# Patient Record
Sex: Female | Born: 1975 | Race: Black or African American | Hispanic: No | Marital: Married | Smoking: Never smoker
Health system: Southern US, Community
[De-identification: ages and names within clinical notes are randomized; demographics above are authoritative.]

## PROBLEM LIST (undated history)

## (undated) DIAGNOSIS — R87629 Unspecified abnormal cytological findings in specimens from vagina: Secondary | ICD-10-CM

## (undated) HISTORY — DX: Unspecified abnormal cytological findings in specimens from vagina: R87.629

## (undated) HISTORY — PX: NO PAST SURGERIES: SHX2092

---

## 2007-10-25 ENCOUNTER — Emergency Department (HOSPITAL_COMMUNITY): Admission: EM | Admit: 2007-10-25 | Discharge: 2007-10-25 | Payer: Self-pay | Admitting: Emergency Medicine

## 2007-10-25 ENCOUNTER — Ambulatory Visit: Payer: Self-pay | Admitting: Vascular Surgery

## 2007-10-25 ENCOUNTER — Encounter (INDEPENDENT_AMBULATORY_CARE_PROVIDER_SITE_OTHER): Payer: Self-pay | Admitting: Emergency Medicine

## 2012-07-11 ENCOUNTER — Other Ambulatory Visit: Payer: Self-pay

## 2012-07-11 ENCOUNTER — Other Ambulatory Visit (HOSPITAL_COMMUNITY): Payer: Self-pay | Admitting: Obstetrics and Gynecology

## 2012-07-11 DIAGNOSIS — Z3689 Encounter for other specified antenatal screening: Secondary | ICD-10-CM

## 2012-07-11 DIAGNOSIS — O09522 Supervision of elderly multigravida, second trimester: Secondary | ICD-10-CM

## 2012-07-15 ENCOUNTER — Encounter (HOSPITAL_COMMUNITY): Payer: Self-pay | Admitting: Obstetrics and Gynecology

## 2012-07-18 ENCOUNTER — Ambulatory Visit (HOSPITAL_COMMUNITY): Payer: Self-pay

## 2012-07-18 ENCOUNTER — Ambulatory Visit (HOSPITAL_COMMUNITY): Payer: Self-pay | Attending: Obstetrics and Gynecology

## 2012-07-23 ENCOUNTER — Ambulatory Visit (HOSPITAL_COMMUNITY)
Admission: RE | Admit: 2012-07-23 | Discharge: 2012-07-23 | Disposition: A | Discharge status: Home / Self Care | Payer: 59 | Source: Ambulatory Visit | Attending: Obstetrics and Gynecology | Admitting: Obstetrics and Gynecology

## 2012-07-23 ENCOUNTER — Encounter (HOSPITAL_COMMUNITY): Payer: Self-pay

## 2012-07-23 DIAGNOSIS — O0930 Supervision of pregnancy with insufficient antenatal care, unspecified trimester: Secondary | ICD-10-CM | POA: Insufficient documentation

## 2012-07-23 DIAGNOSIS — O09522 Supervision of elderly multigravida, second trimester: Secondary | ICD-10-CM

## 2012-07-23 DIAGNOSIS — E669 Obesity, unspecified: Secondary | ICD-10-CM | POA: Insufficient documentation

## 2012-07-23 DIAGNOSIS — O09529 Supervision of elderly multigravida, unspecified trimester: Secondary | ICD-10-CM | POA: Insufficient documentation

## 2012-07-23 DIAGNOSIS — Z3689 Encounter for other specified antenatal screening: Secondary | ICD-10-CM

## 2012-07-23 NOTE — Progress Notes (Signed)
Genetic Counseling  High-Risk Gestation Note  Appointment Date:  07/23/2012 Referred By: Brittany Bailiff, MD Date of Birth:  1975/04/10 Partner:  Brittany Fowler    Pregnancy History: [id number] Estimated Date of Delivery: 11/01/12 Estimated Gestational Age: [redacted]w[redacted]d Attending: Particia Nearing, MD   [name] and her husband, Mr. Brittany Fowler, were seen for genetic counseling because of a maternal age of 37 y.o.. An English/Swahili interpreter from [facility] provided interpretation during today's visit.      They were counseled regarding maternal age and the association with risk for chromosome conditions due to nondisjunction with aging of the ova.   We reviewed chromosomes, nondisjunction, and the associated 1 in 111 risk for fetal aneuploidy related to a maternal age of 37 y.o. at [redacted]w[redacted]d gestation.  They were counseled that the risk for aneuploidy decreases as gestational age increases, accounting for those pregnancies which spontaneously abort.  We specifically discussed Down syndrome (trisomy 85), trisomies 20 and 73, and sex chromosome aneuploidies (47,XXX and 47,XXY) including the common features and prognoses of each.   We reviewed available screening options including noninvasive prenatal screening (NIPS)/cell free fetal DNA (cffDNA) testing and detailed ultrasound.  They were counseled that screening tests are used to modify a patient's a priori risk for aneuploidy, typically based on age. This estimate provides a pregnancy specific risk assessment. We reviewed the benefits and limitations of each option. Specifically, we discussed the conditions for which each test screens, the detection rates, and false positive rates of each. They were also counseled regarding diagnostic testing via amniocentesis. We reviewed the approximate 1 in 300-500 risk for complications for amniocentesis, including spontaneous preterm labor and delivery. After consideration of all the  options, they declined NIPS and amniocentesis at the time of today's visit.    The patient elected to proceed with detailed ultrasound only.  A complete ultrasound was performed today. The ultrasound report will be sent under separate cover. There were no visualized fetal anomalies or markers suggestive of aneuploidy. Diagnostic testing was declined today.  They understand that screening tests cannot rule out all birth defects or genetic syndromes. The patient was advised of this limitation and states she still does not want additional testing at this time.   [facility] was provided with written information regarding sickle cell anemia (SCA) including the carrier frequency and incidence in the African population, the availability of carrier testing and prenatal diagnosis if indicated.  In addition, we discussed that hemoglobinopathies are routinely screened for as part of the [facility] newborn screening panel.  She declined hemoglobin electrophoresis today given that she was unsure if this was performed at the time of her initial OB blood work in June.  Both family histories were reviewed and found to be noncontributory for birth defects, mental retardation, and known genetic conditions. Without further information regarding the provided family history, an accurate genetic risk cannot be calculated. Further genetic counseling is warranted if more information is obtained.  [name] denied exposure to environmental toxins or chemical agents. She denied the use of alcohol, tobacco or street drugs. She denied significant viral illnesses during the course of her pregnancy. Her medical and surgical histories were noncontributory.   I counseled this couple regarding the above risks and available options.  The approximate face-to-face time with the genetic counselor was 35 minutes.  Brittany Plowman, MS,  Certified Genetic Counselor 07/23/2012

## 2013-05-28 ENCOUNTER — Encounter (HOSPITAL_COMMUNITY): Payer: Self-pay | Admitting: *Deleted

## 2013-11-17 ENCOUNTER — Encounter (HOSPITAL_COMMUNITY): Payer: Self-pay | Admitting: *Deleted

## 2014-11-02 LAB — OB RESULTS CONSOLE GC/CHLAMYDIA
CHLAMYDIA, DNA PROBE: NEGATIVE
Gonorrhea: NEGATIVE

## 2014-11-02 LAB — OB RESULTS CONSOLE ABO/RH: RH Type: POSITIVE

## 2014-11-02 LAB — OB RESULTS CONSOLE VARICELLA ZOSTER ANTIBODY, IGG: Varicella: IMMUNE

## 2014-11-02 LAB — OB RESULTS CONSOLE RPR: RPR: NONREACTIVE

## 2014-11-02 LAB — OB RESULTS CONSOLE HIV ANTIBODY (ROUTINE TESTING): HIV: NONREACTIVE

## 2014-11-02 LAB — OB RESULTS CONSOLE PLATELET COUNT: PLATELETS: 228 10*3/uL

## 2014-11-02 LAB — OB RESULTS CONSOLE HGB/HCT, BLOOD
HEMATOCRIT: 34 %
HEMOGLOBIN: 11.8 g/dL

## 2014-11-02 LAB — OB RESULTS CONSOLE ANTIBODY SCREEN: ANTIBODY SCREEN: NEGATIVE

## 2014-11-03 ENCOUNTER — Encounter (HOSPITAL_COMMUNITY): Payer: Self-pay | Admitting: Obstetrics and Gynecology

## 2014-11-09 ENCOUNTER — Other Ambulatory Visit (HOSPITAL_COMMUNITY): Payer: Self-pay | Admitting: Obstetrics and Gynecology

## 2014-11-09 DIAGNOSIS — O09522 Supervision of elderly multigravida, second trimester: Secondary | ICD-10-CM

## 2014-11-09 DIAGNOSIS — Z3689 Encounter for other specified antenatal screening: Secondary | ICD-10-CM

## 2014-11-10 ENCOUNTER — Encounter (HOSPITAL_COMMUNITY): Payer: Medicaid Other

## 2014-11-10 ENCOUNTER — Ambulatory Visit (HOSPITAL_COMMUNITY): Payer: Medicaid Other

## 2014-11-11 ENCOUNTER — Encounter (HOSPITAL_COMMUNITY): Payer: Medicaid Other

## 2014-11-11 ENCOUNTER — Ambulatory Visit (HOSPITAL_COMMUNITY): Payer: Medicaid Other

## 2014-11-18 ENCOUNTER — Ambulatory Visit (HOSPITAL_COMMUNITY): Payer: Medicaid Other

## 2014-11-18 ENCOUNTER — Ambulatory Visit (HOSPITAL_COMMUNITY): Admission: RE | Admit: 2014-11-18 | Payer: Medicaid Other | Source: Ambulatory Visit

## 2014-11-18 ENCOUNTER — Encounter (HOSPITAL_COMMUNITY): Payer: Medicaid Other

## 2014-11-26 ENCOUNTER — Encounter (HOSPITAL_COMMUNITY): Payer: Self-pay

## 2014-11-26 ENCOUNTER — Other Ambulatory Visit (HOSPITAL_COMMUNITY): Payer: Self-pay | Admitting: Obstetrics and Gynecology

## 2014-11-26 ENCOUNTER — Encounter (HOSPITAL_COMMUNITY): Payer: Medicaid Other

## 2014-11-26 ENCOUNTER — Ambulatory Visit (HOSPITAL_COMMUNITY)
Admission: RE | Admit: 2014-11-26 | Discharge: 2014-11-26 | Disposition: A | Discharge status: Home / Self Care | Payer: Medicaid Other | Source: Ambulatory Visit | Attending: Obstetrics and Gynecology | Admitting: Obstetrics and Gynecology

## 2014-11-26 DIAGNOSIS — O99212 Obesity complicating pregnancy, second trimester: Secondary | ICD-10-CM | POA: Insufficient documentation

## 2014-11-26 DIAGNOSIS — Z3A23 23 weeks gestation of pregnancy: Secondary | ICD-10-CM

## 2014-11-26 DIAGNOSIS — O09522 Supervision of elderly multigravida, second trimester: Secondary | ICD-10-CM

## 2014-11-26 DIAGNOSIS — Z3689 Encounter for other specified antenatal screening: Secondary | ICD-10-CM

## 2014-11-26 DIAGNOSIS — O281 Abnormal biochemical finding on antenatal screening of mother: Secondary | ICD-10-CM | POA: Insufficient documentation

## 2014-11-26 DIAGNOSIS — Z36 Encounter for antenatal screening of mother: Secondary | ICD-10-CM | POA: Diagnosis not present

## 2014-11-26 DIAGNOSIS — Z3A22 22 weeks gestation of pregnancy: Secondary | ICD-10-CM | POA: Insufficient documentation

## 2014-11-27 ENCOUNTER — Other Ambulatory Visit (HOSPITAL_COMMUNITY): Payer: Self-pay

## 2014-12-22 ENCOUNTER — Encounter: Payer: Self-pay | Admitting: *Deleted

## 2014-12-22 ENCOUNTER — Encounter (HOSPITAL_COMMUNITY): Payer: Self-pay

## 2014-12-22 DIAGNOSIS — O09519 Supervision of elderly primigravida, unspecified trimester: Secondary | ICD-10-CM | POA: Insufficient documentation

## 2014-12-22 DIAGNOSIS — O24410 Gestational diabetes mellitus in pregnancy, diet controlled: Secondary | ICD-10-CM | POA: Insufficient documentation

## 2014-12-22 DIAGNOSIS — O24419 Gestational diabetes mellitus in pregnancy, unspecified control: Secondary | ICD-10-CM

## 2014-12-29 ENCOUNTER — Other Ambulatory Visit: Payer: Self-pay | Admitting: Obstetrics & Gynecology

## 2014-12-29 DIAGNOSIS — Z3A31 31 weeks gestation of pregnancy: Secondary | ICD-10-CM

## 2014-12-29 DIAGNOSIS — O09523 Supervision of elderly multigravida, third trimester: Secondary | ICD-10-CM

## 2014-12-29 DIAGNOSIS — Z3689 Encounter for other specified antenatal screening: Secondary | ICD-10-CM

## 2015-01-18 ENCOUNTER — Encounter: Payer: Self-pay | Admitting: Obstetrics & Gynecology

## 2015-01-18 ENCOUNTER — Encounter: Payer: Medicaid Other | Attending: Obstetrics & Gynecology | Admitting: *Deleted

## 2015-01-18 ENCOUNTER — Ambulatory Visit (INDEPENDENT_AMBULATORY_CARE_PROVIDER_SITE_OTHER): Payer: Medicaid Other | Admitting: Obstetrics & Gynecology

## 2015-01-18 VITALS — BP 112/70 | HR 102 | Temp 98.8°F | Wt 241.6 lb

## 2015-01-18 DIAGNOSIS — O09523 Supervision of elderly multigravida, third trimester: Secondary | ICD-10-CM

## 2015-01-18 DIAGNOSIS — O99213 Obesity complicating pregnancy, third trimester: Secondary | ICD-10-CM

## 2015-01-18 DIAGNOSIS — Z713 Dietary counseling and surveillance: Secondary | ICD-10-CM | POA: Insufficient documentation

## 2015-01-18 DIAGNOSIS — E669 Obesity, unspecified: Secondary | ICD-10-CM

## 2015-01-18 DIAGNOSIS — O24419 Gestational diabetes mellitus in pregnancy, unspecified control: Secondary | ICD-10-CM | POA: Diagnosis present

## 2015-01-18 LAB — POCT URINALYSIS DIP (DEVICE)
Glucose, UA: NEGATIVE mg/dL
HGB URINE DIPSTICK: NEGATIVE
LEUKOCYTES UA: NEGATIVE
NITRITE: NEGATIVE
PH: 6.5 (ref 5.0–8.0)
Protein, ur: NEGATIVE mg/dL
Specific Gravity, Urine: 1.02 (ref 1.005–1.030)
Urobilinogen, UA: 0.2 mg/dL (ref 0.0–1.0)

## 2015-01-18 MED ORDER — ACCU-CHEK MULTICLIX LANCETS MISC: Status: DC

## 2015-01-18 MED ORDER — GLUCOSE BLOOD VI STRP: ORAL_STRIP | Status: DC

## 2015-01-18 NOTE — Progress Notes (Signed)
Here for first visit. Transferring care from health department. Given new patient education booklets. Used [employer].

## 2015-01-18 NOTE — Progress Notes (Signed)
   Subjective: transfer from [facility] for failed 1 hr gtt 228    Brittany Fowler is a [id number] 4028w5d being seen today for her first obstetrical visit.  Her obstetrical history is significant for advanced maternal age, obesity and gestational diabetes. Patient does intend to breast feed. Pregnancy history fully reviewed.  Patient reports no complaints.  Filed Vitals:   01/18/15 0940  BP: 112/70  Pulse: 102  Temp: 98.8 F (37.1 C)  Weight: 241 lb 9.6 oz (109.589 kg)    HISTORY: OB History  Gravida Para Term Preterm AB SAB TAB Ectopic Multiple Living  8 6 6  0 1 1 0 0  6    # Outcome Date GA Lbr Len/2nd Weight Sex Delivery Anes PTL Lv  8 Current           7 SAB 05/2014          6 Term 11/04/12 3375w0d  9 lb (4.082 kg) F Vag-Spont None N Y     Comments: states blood sugar was high at first , then normal   5 Term 09/18/09 6175w0d  9 lb (4.082 kg) M Vag-Spont None N Y  4 Term 07/15/02 8075w0d  6 lb 6 oz (2.892 kg) M Vag-Spont None N Y  3 Term 03/17/99 4875w0d  8 lb 8 oz (3.856 kg) F Vag-Spont None N Y  2 Term 02/22/97 6775w0d  7 lb (3.175 kg) M Vag-Spont None N Y  1 Term 08/06/94 5975w0d  7 lb 7 oz (3.374 kg) F Vag-Spont None N Y     Past Medical History  Diagnosis Date  . Vaginal Pap smear, abnormal     2011   Past Surgical History  Procedure Laterality Date  . No past surgeries     History reviewed. No pertinent family history.   Exam    Uterus:  Fundal Height: 34 cm  Pelvic Exam:                                    Skin: normal coloration and turgor, no rashes    Neurologic: oriented, normal mood   Extremities: normal strength, tone, and muscle mass   HEENT extra ocular movement intact   Mouth/Teeth dental hygiene good   Neck supple   Cardiovascular: regular rate and rhythm   Respiratory:  appears well, vitals normal, no respiratory distress, a              Assessment:    Pregnancy: [id number] Patient Active Problem List   Diagnosis Date Noted  .  Gestational diabetes mellitus (GDM), antepartum 12/22/2014  . AMA (advanced maternal age) primigravida 35+ 12/22/2014        Plan:     Initial labs drawn. Prenatal vitamins. Problem list reviewed and updated. Genetic Screening discussed was declined  Ultrasound discussed; fetal survey: results reviewed.  Follow up in 1 weeks. 50% of 30 min visit spent on counseling and coordination of care.  DM education today, BS 4/day   ARNOLD,JAMES 01/18/2015

## 2015-01-18 NOTE — Progress Notes (Signed)
  Patient was seen on 01/18/15 for Gestational Diabetes self-management . The following learning objectives were met by the patient :   States the definition of Gestational Diabetes  States why dietary management is important in controlling blood glucose  Describes the effects of carbohydrates on blood glucose levels  Demonstrates ability to create a balanced meal plan  States when to check blood glucose levels  Demonstrates proper blood glucose monitoring techniques  Plan:  Begin checking BG before breakfast and 2 hours after first bit of breakfast, lunch and dinner after  as directed by MD  Take medication  as directed by MD  Blood glucose monitor given: Georgeanne Nim Lot # [id number] Exp: 2015/10/16 Blood glucose reading: FBS this date '115mg'$ /dl  Patient instructed to monitor glucose levels: FBS: 60 - <90 2 hour: <120  Patient will be seen for follow-up as needed.

## 2015-01-18 NOTE — Patient Instructions (Signed)
Gestational Diabetes Mellitus  Gestational diabetes mellitus, often simply referred to as gestational diabetes, is a type of diabetes that some women develop during pregnancy. In gestational diabetes, the pancreas does not make enough insulin (a hormone), the cells are less responsive to the insulin that is made (insulin resistance), or both. Normally, insulin moves sugars from food into the tissue cells. The tissue cells use the sugars for energy. The lack of insulin or the lack of normal response to insulin causes excess sugars to build up in the blood instead of going into the tissue cells. As a result, high blood sugar (hyperglycemia) develops. The effect of high sugar (glucose) levels can cause many problems.   RISK FACTORS  You have an increased chance of developing gestational diabetes if you have a family history of diabetes and also have one or more of the following risk factors:  · A body mass index over 30 (obesity).  · A previous pregnancy with gestational diabetes.  · An older age at the time of pregnancy.  If blood glucose levels are kept in the normal range during pregnancy, women can have a healthy pregnancy. If your blood glucose levels are not well controlled, there may be risks to you, your unborn baby (fetus), your labor and delivery, or your newborn baby.   SYMPTOMS   If symptoms are experienced, they are much like symptoms you would normally expect during pregnancy. The symptoms of gestational diabetes include:   · Increased thirst (polydipsia).  · Increased urination (polyuria).  · Increased urination during the night (nocturia).  · Weight loss. This weight loss may be rapid.  · Frequent, recurring infections.  · Tiredness (fatigue).  · Weakness.  · Vision changes, such as blurred vision.  · Fruity smell to your breath.  · Abdominal pain.  DIAGNOSIS  Diabetes is diagnosed when blood glucose levels are increased. Your blood glucose level may be checked by one or more of the following blood  tests:  · A fasting blood glucose test. You will not be allowed to eat for at least 8 hours before a blood sample is taken.  · A random blood glucose test. Your blood glucose is checked at any time of the day regardless of when you ate.  · An oral glucose tolerance test (OGTT). Your blood glucose is measured after you have not eaten (fasted) for 1-3 hours and then after you drink a glucose-containing beverage. Since the hormones that cause insulin resistance are highest at about 24-28 weeks of a pregnancy, an OGTT is usually performed during that time. If you have risk factors, you may be screened for undiagnosed type 2 diabetes at your first prenatal visit.  TREATMENT   Gestational diabetes should be managed first with diet and exercise. Medicines may be added only if they are needed.  · You will need to take diabetes medicine or insulin daily to keep blood glucose levels in the desired range.  · You will need to match insulin dosing with exercise and healthy food choices.  If you have gestational diabetes, your treatment goal is to maintain the following blood glucose levels:  · Before meals (preprandial): at or below 95 mg/dL.  · After meals (postprandial):    One hour after a meal: at or below 140 mg/dL.    Two hours after a meal: at or below 120 mg/dL.  If you have pre-existing type 1 or type 2 diabetes, your treatment goal is to maintain the following blood glucose levels:  · Before   meals, at bedtime, and overnight: 60-99 mg/dL.  · After meals: peak of 100-129 mg/dL.  HOME CARE INSTRUCTIONS   · Have your hemoglobin A1c level checked twice a year.  · Perform daily blood glucose monitoring as directed by your health care provider. It is common to perform frequent blood glucose monitoring.  · Monitor urine ketones when you are ill and as directed by your health care provider.  · Take your diabetes medicine and insulin as directed by your health care provider to maintain your blood glucose level in the desired  range.  ¨ Never run out of diabetes medicine or insulin. It is needed every day.  ¨ Adjust insulin based on your intake of carbohydrates. Carbohydrates can raise blood glucose levels but need to be included in your diet. Carbohydrates provide vitamins, minerals, and fiber which are an essential part of a healthy diet. Carbohydrates are found in fruits, vegetables, whole grains, dairy products, legumes, and foods containing added sugars.  · Eat healthy foods. Alternate 3 meals with 3 snacks.  · Maintain a healthy weight gain. The usual total expected weight gain varies according to your prepregnancy body mass index (BMI).  · Carry a medical alert card or wear your medical alert jewelry.  · Carry a 15-gram carbohydrate snack with you at all times to treat low blood glucose (hypoglycemia). Some examples of 15-gram carbohydrate snacks include:  ¨ Glucose tablets, 3 or 4.  ¨ Glucose gel, 15-gram tube.  ¨ Raisins, 2 tablespoons (24 g).  ¨ Jelly beans, 6.  ¨ Animal crackers, 8.  ¨ Fruit juice, regular soda, or low-fat milk, 4 ounces (120 mL).  ¨ Gummy treats, 9.  · Recognize hypoglycemia. Hypoglycemia during pregnancy occurs with blood glucose levels of 60 mg/dL and below. The risk for hypoglycemia increases when fasting or skipping meals, during or after intense exercise, and during sleep. Hypoglycemia symptoms can include:  ¨ Tremors or shakes.  ¨ Decreased ability to concentrate.  ¨ Sweating.  ¨ Increased heart rate.  ¨ Headache.  ¨ Dry mouth.  ¨ Hunger.  ¨ Irritability.  ¨ Anxiety.  ¨ Restless sleep.  ¨ Altered speech or coordination.  ¨ Confusion.  · Treat hypoglycemia promptly. If you are alert and able to safely swallow, follow the 15:15 rule:  ¨ Take 15-20 grams of rapid-acting glucose or carbohydrate. Rapid-acting options include glucose gel, glucose tablets, or 4 ounces (120 mL) of fruit juice, regular soda, or low-fat milk.  ¨ Check your blood glucose level 15 minutes after taking the glucose.  ¨ Take 15-20  grams more of glucose if the repeat blood glucose level is still 70 mg/dL or below.  ¨ Eat a meal or snack within 1 hour once blood glucose levels return to normal.  · Be alert to polyuria (excess urination) and polydipsia (excess thirst) which are early signs of hyperglycemia. An early awareness of hyperglycemia allows for prompt treatment. Treat hyperglycemia as directed by your health care provider.  · Engage in at least 30 minutes of physical activity a day or as directed by your health care provider. Ten minutes of physical activity timed 30 minutes after each meal is encouraged to control postprandial blood glucose levels.  · Adjust your insulin dosing and food intake as needed if you start a new exercise or sport.  · Follow your sick-day plan at any time you are unable to eat or drink as usual.  · Avoid tobacco and alcohol use.  · Keep all follow-up visits as directed   by your health care provider.  · Follow the advice of your health care provider regarding your prenatal and post-delivery (postpartum) appointments, meal planning, exercise, medicines, vitamins, blood tests, other medical tests, and physical activities.  · Perform daily skin and foot care. Examine your skin and feet daily for cuts, bruises, redness, nail problems, bleeding, blisters, or sores.  · Brush your teeth and gums at least twice a day and floss at least once a day. Follow up with your dentist regularly.  · Schedule an eye exam during the first trimester of your pregnancy or as directed by your health care provider.  · Share your diabetes management plan with your workplace or school.  · Stay up-to-date with immunizations.  · Learn to manage stress.  · Obtain ongoing diabetes education and support as needed.  · Learn about and consider breastfeeding your baby.  · You should have your blood sugar level checked 6-12 weeks after delivery. This is done with an oral glucose tolerance test (OGTT).  SEEK MEDICAL CARE IF:   · You are unable to  eat food or drink fluids for more than 6 hours.  · You have nausea and vomiting for more than 6 hours.  · You have a blood glucose level of 200 mg/dL and you have ketones in your urine.  · There is a change in mental status.  · You develop vision problems.  · You have a persistent headache.  · You have upper abdominal pain or discomfort.  · You develop an additional serious illness.  · You have diarrhea for more than 6 hours.  · You have been sick or have had a fever for a couple of days and are not getting better.  SEEK IMMEDIATE MEDICAL CARE IF:   · You have difficulty breathing.  · You no longer feel the baby moving.  · You are bleeding or have discharge from your vagina.  · You start having premature contractions or labor.  MAKE SURE YOU:  · Understand these instructions.  · Will watch your condition.  · Will get help right away if you are not doing well or get worse.     This information is not intended to replace advice given to you by your health care provider. Make sure you discuss any questions you have with your health care provider.     Document Released: 04/10/2000 Document Revised: 01/23/2014 Document Reviewed: 08/01/2011  Elsevier Interactive Patient Education ©2016 Elsevier Inc[employer]

## 2015-01-21 ENCOUNTER — Ambulatory Visit (HOSPITAL_COMMUNITY)
Admission: RE | Admit: 2015-01-21 | Discharge: 2015-01-21 | Disposition: A | Discharge status: Home / Self Care | Payer: Medicaid Other | Source: Ambulatory Visit | Attending: Obstetrics & Gynecology | Admitting: Obstetrics & Gynecology

## 2015-01-21 ENCOUNTER — Other Ambulatory Visit: Payer: Self-pay | Admitting: Obstetrics & Gynecology

## 2015-01-21 VITALS — BP 107/68 | HR 75 | Wt 242.8 lb

## 2015-01-21 DIAGNOSIS — O99213 Obesity complicating pregnancy, third trimester: Secondary | ICD-10-CM | POA: Diagnosis not present

## 2015-01-21 DIAGNOSIS — O24410 Gestational diabetes mellitus in pregnancy, diet controlled: Secondary | ICD-10-CM

## 2015-01-21 DIAGNOSIS — Z3A31 31 weeks gestation of pregnancy: Secondary | ICD-10-CM | POA: Diagnosis not present

## 2015-01-21 DIAGNOSIS — O281 Abnormal biochemical finding on antenatal screening of mother: Secondary | ICD-10-CM

## 2015-01-21 DIAGNOSIS — O09523 Supervision of elderly multigravida, third trimester: Secondary | ICD-10-CM

## 2015-01-21 DIAGNOSIS — O09513 Supervision of elderly primigravida, third trimester: Secondary | ICD-10-CM

## 2015-01-21 DIAGNOSIS — Z3689 Encounter for other specified antenatal screening: Secondary | ICD-10-CM

## 2015-01-25 ENCOUNTER — Encounter: Payer: Medicaid Other | Admitting: *Deleted

## 2015-01-25 ENCOUNTER — Ambulatory Visit (INDEPENDENT_AMBULATORY_CARE_PROVIDER_SITE_OTHER): Payer: Medicaid Other | Admitting: Obstetrics and Gynecology

## 2015-01-25 VITALS — BP 124/72 | HR 95 | Temp 97.8°F | Wt 239.7 lb

## 2015-01-25 DIAGNOSIS — O24419 Gestational diabetes mellitus in pregnancy, unspecified control: Secondary | ICD-10-CM | POA: Diagnosis not present

## 2015-01-25 DIAGNOSIS — Z23 Encounter for immunization: Secondary | ICD-10-CM | POA: Diagnosis not present

## 2015-01-25 LAB — POCT URINALYSIS DIP (DEVICE)
Bilirubin Urine: NEGATIVE
Glucose, UA: NEGATIVE mg/dL
HGB URINE DIPSTICK: NEGATIVE
KETONES UR: NEGATIVE mg/dL
Leukocytes, UA: NEGATIVE
Nitrite: NEGATIVE
PH: 6.5 (ref 5.0–8.0)
PROTEIN: NEGATIVE mg/dL
Specific Gravity, Urine: 1.02 (ref 1.005–1.030)
Urobilinogen, UA: 0.2 mg/dL (ref 0.0–1.0)

## 2015-01-25 LAB — CBC
HEMATOCRIT: 35.5 % — AB (ref 36.0–46.0)
Hemoglobin: 11.9 g/dL — ABNORMAL LOW (ref 12.0–15.0)
MCH: 28.3 pg (ref 26.0–34.0)
MCHC: 33.5 g/dL (ref 30.0–36.0)
MCV: 84.5 fL (ref 78.0–100.0)
MPV: 9.2 fL (ref 8.6–12.4)
Platelets: 243 10*3/uL (ref 150–400)
RBC: 4.2 MIL/uL (ref 3.87–5.11)
RDW: 14.5 % (ref 11.5–15.5)
WBC: 6.8 10*3/uL (ref 4.0–10.5)

## 2015-01-25 MED ORDER — TETANUS-DIPHTH-ACELL PERTUSSIS 5-2.5-18.5 LF-MCG/0.5 IM SUSP
0.5000 mL | Freq: Once | INTRAMUSCULAR | Status: AC
  Administered 2015-01-25: 0.5 mL via INTRAMUSCULAR

## 2015-01-25 NOTE — Progress Notes (Signed)
Subjective:  Brynda RimHamenyimana Kanai is a 40 y.o. [id number] at 5871w5d being seen today for ongoing prenatal care.  She is currently monitored for the following issues for this high-risk pregnancy and has Gestational diabetes mellitus (GDM), antepartum and AMA (advanced maternal age) primigravida 35+ on her problem list.  Patient reports no complaints.  Contractions: Not present. Vag. Bleeding: None.  Movement: Present. Denies leaking of fluid.   Hasn't checked glucose for one week. Medicaid ran out, doesn't have needles and strips.   The following portions of the patient's history were reviewed and updated as appropriate: allergies, current medications, past family history, past medical history, past social history, past surgical history and problem list. Problem list updated.  Objective:   Filed Vitals:   01/25/15 0948  BP: 124/72  Pulse: 95  Temp: 97.8 F (36.6 C)  Weight: 239 lb 11.2 oz (108.727 kg)    Fetal Status: Fetal Heart Rate (bpm): 150   Movement: Present     General:  Alert, oriented and cooperative. Patient is in no acute distress.  Skin: Skin is warm and dry. No rash noted.   Cardiovascular: Normal heart rate noted  Respiratory: Normal respiratory effort, no problems with respiration noted  Abdomen: Soft, gravid, appropriate for gestational age. Pain/Pressure: Absent     Pelvic: Vag. Bleeding: None     Cervical exam deferred        Extremities: Normal range of motion.  Edema: None  Mental Status: Normal mood and affect. Normal behavior. Normal judgment and thought content.   Urinalysis: Urine Protein: Negative Urine Glucose: Negative  Assessment and Plan:  Pregnancy: [id number] at 7671w5d  1. Gestational diabetes mellitus (GDM), antepartum, gestational diabetes method of control unspecified - meet with diabetes educator, get testing supplies, f/u 1 week, if a2gdm would need to start antenatal testing  - CBC - HIV antibody (with reflex) - RPR - tdap - we have no records  of pap smear; patient confident this was done at [facility], will submit records request  Preterm labor symptoms and general obstetric precautions including but not limited to vaginal bleeding, contractions, leaking of fluid and fetal movement were reviewed in detail with the patient. Please refer to After Visit Summary for other counseling recommendations.    Kathrynn RunningNoah Bedford [place], MD

## 2015-01-25 NOTE — Addendum Note (Signed)
Addended by: Garret ReddishBARNES, Kimm Ungaro M on: 01/25/2015 11:10 AM   Modules accepted: Orders

## 2015-01-25 NOTE — Progress Notes (Signed)
28 week labs today Tdap Vaccine undecided Breastfeeding tip of the week reviewed [facility] [place] interpreter [phone] used

## 2015-01-25 NOTE — Progress Notes (Signed)
Patient presents with husband noting that she does not have Medicaid as we initially thought. Therefore she has not been testing since her last visit. Her FBS  This date was 128mg /dl.  Meter Dispensed: [employer] Lot: [id number] Exp: 2017-03-11 FBS 128mg /dl I will see patient in one week to review glucose readings.

## 2015-01-26 LAB — RPR

## 2015-01-26 LAB — HIV ANTIBODY (ROUTINE TESTING W REFLEX): HIV 1&2 Ab, 4th Generation: NONREACTIVE

## 2015-01-29 ENCOUNTER — Encounter: Payer: Self-pay | Admitting: *Deleted

## 2015-01-29 DIAGNOSIS — O24419 Gestational diabetes mellitus in pregnancy, unspecified control: Secondary | ICD-10-CM

## 2015-02-01 ENCOUNTER — Encounter: Payer: Medicaid Other | Admitting: *Deleted

## 2015-02-01 ENCOUNTER — Ambulatory Visit: Payer: Medicaid Other | Admitting: *Deleted

## 2015-02-01 DIAGNOSIS — O24419 Gestational diabetes mellitus in pregnancy, unspecified control: Secondary | ICD-10-CM | POA: Diagnosis not present

## 2015-02-01 NOTE — Progress Notes (Signed)
Patient presents with husband for review of glucose readings. FBS range 89-133, 2hpp range 101-145. Patient is having too many carbs with meals and not having snacks. Has reeducated on dietary balance. Patient and husband verbalize understanding.

## 2015-02-08 ENCOUNTER — Ambulatory Visit (INDEPENDENT_AMBULATORY_CARE_PROVIDER_SITE_OTHER): Payer: Medicaid Other | Admitting: Family Medicine

## 2015-02-08 ENCOUNTER — Telehealth: Payer: Self-pay | Admitting: General Practice

## 2015-02-08 VITALS — BP 115/73 | HR 93 | Temp 99.1°F | Wt 238.8 lb

## 2015-02-08 DIAGNOSIS — O09523 Supervision of elderly multigravida, third trimester: Secondary | ICD-10-CM | POA: Diagnosis not present

## 2015-02-08 DIAGNOSIS — O98813 Other maternal infectious and parasitic diseases complicating pregnancy, third trimester: Secondary | ICD-10-CM | POA: Diagnosis not present

## 2015-02-08 DIAGNOSIS — O24419 Gestational diabetes mellitus in pregnancy, unspecified control: Secondary | ICD-10-CM | POA: Diagnosis not present

## 2015-02-08 DIAGNOSIS — B373 Candidiasis of vulva and vagina: Secondary | ICD-10-CM

## 2015-02-08 DIAGNOSIS — O24414 Gestational diabetes mellitus in pregnancy, insulin controlled: Secondary | ICD-10-CM

## 2015-02-08 DIAGNOSIS — O0990 Supervision of high risk pregnancy, unspecified, unspecified trimester: Secondary | ICD-10-CM | POA: Insufficient documentation

## 2015-02-08 DIAGNOSIS — B372 Candidiasis of skin and nail: Secondary | ICD-10-CM

## 2015-02-08 DIAGNOSIS — O0993 Supervision of high risk pregnancy, unspecified, third trimester: Secondary | ICD-10-CM

## 2015-02-08 LAB — POCT URINALYSIS DIP (DEVICE)
GLUCOSE, UA: NEGATIVE mg/dL
Nitrite: NEGATIVE
PH: 7 (ref 5.0–8.0)
Protein, ur: 30 mg/dL — AB
Specific Gravity, Urine: 1.02 (ref 1.005–1.030)
Urobilinogen, UA: 1 mg/dL (ref 0.0–1.0)

## 2015-02-08 MED ORDER — NYSTATIN EX POWD: 1.0000 "application " | Freq: Two times a day (BID) | CUTANEOUS | Status: AC

## 2015-02-08 MED ORDER — GLYBURIDE 2.5 MG PO TABS
2.5000 mg | ORAL_TABLET | Freq: Every day | ORAL | Status: DC
Start: 2015-02-08 — End: 2015-02-22

## 2015-02-08 NOTE — Progress Notes (Signed)
Subjective:  Brittany Fowler is a 40 y.o. [id number] at 85w5dbeing seen today for ongoing prenatal care.  She is currently monitored for the following issues for this high-risk pregnancy and has Gestational diabetes mellitus (GDM), antepartum; AMA (advanced maternal age) primigravida 359+ and Supervision of high risk pregnancy, antepartum on her problem list.  Patient reports no complaints.  Contractions: Not present. Vag. Bleeding: None.  Movement: Present. Denies leaking of fluid.   Gestational DM- met with DM eduction and had poorly controlled sugars on 1/16. Counseled on dietary changes at the time.  Fasting 99, 111, 99, 104, 86, 106, 95, 91  2hr Bfast 96, 117, 105, 99, 122, 85, 119 2hr lunch 119, 120, 118, 127, 133, 128, 108 2hr Dinner 116, 115, 123, 100, 125, 110   The following portions of the patient's history were reviewed and updated as appropriate: allergies, current medications, past family history, past medical history, past social history, past surgical history and problem list. Problem list updated.  Objective:   Filed Vitals:   02/08/15 0947  BP: 115/73  Pulse: 93  Temp: 99.1 F (37.3 C)  Weight: 238 lb 12.8 oz (108.319 kg)    Fetal Status:   Fundal Height: 36 cm Movement: Present     General:  Alert, oriented and cooperative. Patient is in no acute distress.  Skin: Skin is warm and dry. No rash noted.   Cardiovascular: Normal heart rate noted  Respiratory: Normal respiratory effort, no problems with respiration noted  Abdomen: Soft, gravid, appropriate for gestational age. Pain/Pressure: Absent     Pelvic: Vag. Bleeding: None     Cervical exam deferred        Extremities: Normal range of motion.  Edema: Trace  Mental Status: Normal mood and affect. Normal behavior. Normal judgment and thought content.   Urinalysis: Urine Protein: 1+ Urine Glucose: Negative  Assessment and Plan:  Pregnancy: [id number] 382w5d1. Supervision of high risk pregnancy,  antepartum, third trimester -updated box  2. Gestational diabetes mellitus (GDM), antepartum, gestational diabetes method of control unspecified - Diet controlled currently but approaching the need for glyburide, 42% of values above goal. start Glyburide 2.5 qpm.  - encouraged diet changes - appt with DM education  - schedule NST for 40 weeks  Preterm labor symptoms and general obstetric precautions including but not limited to vaginal bleeding, contractions, leaking of fluid and fetal movement were reviewed in detail with the patient. Please refer to After Visit Summary for other counseling recommendations.  Return in about 1 week (around 02/15/2015) for DM education only.  Future Appointments Date Time Provider [place]1/30/2017 9:00 AM [place]2/02/2015 10:30 AM [facility] [place] [facility]  02/22/2015 9:05 AM JaTruett MainlandDO [facility]OC-[facility] WOC     KiCaren MacadamMD

## 2015-02-08 NOTE — Patient Instructions (Signed)

## 2015-02-08 NOTE — Progress Notes (Signed)
Urine: trace hgb, ketones, small amt wbcs Breastfeeding tip of the week reviewed Swahili interpreter used via language line

## 2015-02-08 NOTE — Telephone Encounter (Signed)
Per Dr Alvester Morin, patient needs to begin glyburide at bedtime as some of her blood sugars have been elevated. Called patient with pac[place] interpreter [phone], and informed her of prescription. Patient verbalized understanding and states with her last child she had this problem and changed her diet and everything was fine. Patient states she prefers to change her diet over the next week and when we see her on Monday if her blood sugars are still elevated then she will take the medication. Recommended to patient to take the medication as directed & recommended by her doctor and that we will be sending the Rx in. Patient verbalized understanding and states she prefers to change her diet first but will follow up with [place] on Monday. Patient had no other questions

## 2015-02-15 ENCOUNTER — Ambulatory Visit: Payer: Medicaid Other

## 2015-02-15 ENCOUNTER — Encounter: Payer: Medicaid Other | Attending: Obstetrics & Gynecology | Admitting: *Deleted

## 2015-02-15 DIAGNOSIS — Z713 Dietary counseling and surveillance: Secondary | ICD-10-CM | POA: Diagnosis not present

## 2015-02-15 DIAGNOSIS — O24419 Gestational diabetes mellitus in pregnancy, unspecified control: Secondary | ICD-10-CM | POA: Insufficient documentation

## 2015-02-18 ENCOUNTER — Encounter (HOSPITAL_COMMUNITY): Payer: Self-pay

## 2015-02-18 ENCOUNTER — Ambulatory Visit (HOSPITAL_COMMUNITY)
Admission: RE | Admit: 2015-02-18 | Discharge: 2015-02-18 | Disposition: A | Discharge status: Home / Self Care | Payer: Medicaid Other | Source: Ambulatory Visit | Attending: Obstetrics and Gynecology | Admitting: Obstetrics and Gynecology

## 2015-02-18 VITALS — BP 112/78 | HR 92 | Wt 240.0 lb

## 2015-02-18 DIAGNOSIS — O0993 Supervision of high risk pregnancy, unspecified, third trimester: Secondary | ICD-10-CM

## 2015-02-18 DIAGNOSIS — Z3A35 35 weeks gestation of pregnancy: Secondary | ICD-10-CM | POA: Diagnosis not present

## 2015-02-18 DIAGNOSIS — O09523 Supervision of elderly multigravida, third trimester: Secondary | ICD-10-CM | POA: Insufficient documentation

## 2015-02-18 DIAGNOSIS — O99213 Obesity complicating pregnancy, third trimester: Secondary | ICD-10-CM | POA: Insufficient documentation

## 2015-02-18 DIAGNOSIS — O09513 Supervision of elderly primigravida, third trimester: Secondary | ICD-10-CM

## 2015-02-18 DIAGNOSIS — O281 Abnormal biochemical finding on antenatal screening of mother: Secondary | ICD-10-CM | POA: Insufficient documentation

## 2015-02-18 DIAGNOSIS — O24410 Gestational diabetes mellitus in pregnancy, diet controlled: Secondary | ICD-10-CM | POA: Diagnosis not present

## 2015-02-22 ENCOUNTER — Ambulatory Visit (INDEPENDENT_AMBULATORY_CARE_PROVIDER_SITE_OTHER): Payer: Medicaid Other | Admitting: Family Medicine

## 2015-02-22 ENCOUNTER — Other Ambulatory Visit (HOSPITAL_COMMUNITY)
Admission: RE | Admit: 2015-02-22 | Discharge: 2015-02-22 | Disposition: A | Discharge status: Home / Self Care | Payer: Medicaid Other | Source: Ambulatory Visit | Attending: Family Medicine | Admitting: Family Medicine

## 2015-02-22 VITALS — BP 113/76 | HR 93 | Temp 98.5°F | Ht 61.0 in | Wt 239.9 lb

## 2015-02-22 DIAGNOSIS — O0943 Supervision of pregnancy with grand multiparity, third trimester: Secondary | ICD-10-CM

## 2015-02-22 DIAGNOSIS — Z113 Encounter for screening for infections with a predominantly sexual mode of transmission: Secondary | ICD-10-CM | POA: Insufficient documentation

## 2015-02-22 DIAGNOSIS — O0993 Supervision of high risk pregnancy, unspecified, third trimester: Secondary | ICD-10-CM

## 2015-02-22 DIAGNOSIS — O24419 Gestational diabetes mellitus in pregnancy, unspecified control: Secondary | ICD-10-CM

## 2015-02-22 DIAGNOSIS — O09513 Supervision of elderly primigravida, third trimester: Secondary | ICD-10-CM

## 2015-02-22 DIAGNOSIS — O09293 Supervision of pregnancy with other poor reproductive or obstetric history, third trimester: Secondary | ICD-10-CM | POA: Diagnosis not present

## 2015-02-22 DIAGNOSIS — O09299 Supervision of pregnancy with other poor reproductive or obstetric history, unspecified trimester: Secondary | ICD-10-CM | POA: Insufficient documentation

## 2015-02-22 DIAGNOSIS — O0940 Supervision of pregnancy with grand multiparity, unspecified trimester: Secondary | ICD-10-CM | POA: Insufficient documentation

## 2015-02-22 LAB — POCT URINALYSIS DIP (DEVICE)
Bilirubin Urine: NEGATIVE
Glucose, UA: NEGATIVE mg/dL
HGB URINE DIPSTICK: NEGATIVE
Ketones, ur: NEGATIVE mg/dL
Leukocytes, UA: NEGATIVE
NITRITE: NEGATIVE
PH: 6.5 (ref 5.0–8.0)
Protein, ur: NEGATIVE mg/dL
Specific Gravity, Urine: 1.015 (ref 1.005–1.030)
UROBILINOGEN UA: 0.2 mg/dL (ref 0.0–1.0)

## 2015-02-22 NOTE — Progress Notes (Signed)
Subjective:  Brittany Fowler is a 40 y.o. [id number] at [redacted]w[redacted]d being seen today for ongoing prenatal care.  She is currently monitored for the following issues for this high-risk pregnancy and has Gestational diabetes mellitus (GDM), antepartum; AMA (advanced maternal age) primigravida 17+; Supervision of high risk pregnancy, antepartum; Grand multiparity with current pregnancy, antepartum; and History of macrosomia in infant in prior pregnancy, currently pregnant on her problem list.  Patient reports no complaints.  Contractions: Not present. Vag. Bleeding: None.  Movement: Present. Denies leaking of fluid.   The following portions of the patient's history were reviewed and updated as appropriate: allergies, current medications, past family history, past medical history, past social history, past surgical history and problem list. Problem list updated.  Objective:   Filed Vitals:   02/22/15 1010 02/22/15 1109  BP: 113/76   Pulse: 93   Temp: 98.5 F (36.9 C)   Height:   (1.549 m)  Weight: 239 lb 14.4 oz (108.818 kg)     Fetal Status: Fetal Heart Rate (bpm): 140 Fundal Height: 37 cm Movement: Present  Presentation: Vertex  General:  Alert, oriented and cooperative. Patient is in no acute distress.  Skin: Skin is warm and dry. No rash noted.   Cardiovascular: Normal heart rate noted  Respiratory: Normal respiratory effort, no problems with respiration noted  Abdomen: Soft, gravid, appropriate for gestational age. Pain/Pressure: Absent     Pelvic: Vag. Bleeding: None     Cervical exam performed Dilation: 1 Effacement (%): Thick Station: Ballotable  Extremities: Normal range of motion.  Edema: None  Mental Status: Normal mood and affect. Normal behavior. Normal judgment and thought content.   Urinalysis: Urine Protein: Negative Urine Glucose: Negative  Starting 2/3 Fasting BG: 83, 89, 88, 87 2hr 87, 94, 95, 88, 111, 98, 110, 109  Week prior Fasting: 85, 90, 87, 89, 83,  80 Post-prandial: 96-123 (only 1 of 21 above 120)  Assessment and Plan:  Pregnancy: A5W0981 at [redacted]w[redacted]d  1. AMA (advanced maternal age) primigravida 35+, third trimester declined testing  2. Gestational diabetes mellitus (GDM), antepartum, gestational diabetes method of control unspecified Review of log shows patient is truly diet controlled  (A1GDM) she has glyburide on her medication list but she has not been taking during her pregnancy.  - Discussed IOL at 40 weeks given GDM, patient is resistant at this point, continue to discuss. We reviewed risk of macrosomia and shoulder dystocia today.  - growth [place] scheduled  3. Supervision of high risk pregnancy, antepartum, third trimester Updated box GBS collected today GC/CT collected today  Preterm labor symptoms and general obstetric precautions including but not limited to vaginal bleeding, contractions, leaking of fluid and fetal movement were reviewed in detail with the patient. Please refer to After Visit Summary for other counseling recommendations.  Return in about 2 weeks (around 03/08/2015) for Routine prenatal care.  Future Appointments Date Time Provider Department Center  03/08/2015 9:05 AM Catalina Antigua, MD [facility] WOC  03/11/2015 3:15 PM [facility] [place] 2 WH-US [address] [place], 

## 2015-02-22 NOTE — Progress Notes (Signed)
States has not taken gylburide because she felt cbg ok. Used Interpreter Redgie Grayer.

## 2015-02-23 LAB — GC/CHLAMYDIA PROBE AMP (~~LOC~~) NOT AT ARMC
Chlamydia: NEGATIVE
Neisseria Gonorrhea: NEGATIVE

## 2015-02-24 LAB — CULTURE, BETA STREP (GROUP B ONLY)

## 2015-03-08 ENCOUNTER — Encounter: Payer: Self-pay | Admitting: Obstetrics and Gynecology

## 2015-03-08 ENCOUNTER — Ambulatory Visit (INDEPENDENT_AMBULATORY_CARE_PROVIDER_SITE_OTHER): Payer: Medicaid Other | Admitting: Obstetrics and Gynecology

## 2015-03-08 VITALS — BP 124/81 | HR 89 | Temp 98.2°F | Wt 241.3 lb

## 2015-03-08 DIAGNOSIS — O09293 Supervision of pregnancy with other poor reproductive or obstetric history, third trimester: Secondary | ICD-10-CM | POA: Diagnosis not present

## 2015-03-08 DIAGNOSIS — O0943 Supervision of pregnancy with grand multiparity, third trimester: Secondary | ICD-10-CM

## 2015-03-08 DIAGNOSIS — O0993 Supervision of high risk pregnancy, unspecified, third trimester: Secondary | ICD-10-CM

## 2015-03-08 DIAGNOSIS — O09513 Supervision of elderly primigravida, third trimester: Secondary | ICD-10-CM | POA: Diagnosis not present

## 2015-03-08 DIAGNOSIS — O24410 Gestational diabetes mellitus in pregnancy, diet controlled: Secondary | ICD-10-CM

## 2015-03-08 LAB — POCT URINALYSIS DIP (DEVICE)
GLUCOSE, UA: NEGATIVE mg/dL
Hgb urine dipstick: NEGATIVE
Ketones, ur: 15 mg/dL — AB
Leukocytes, UA: NEGATIVE
Nitrite: NEGATIVE
PH: 6 (ref 5.0–8.0)
PROTEIN: 30 mg/dL — AB
Specific Gravity, Urine: 1.025 (ref 1.005–1.030)
UROBILINOGEN UA: 0.2 mg/dL (ref 0.0–1.0)

## 2015-03-08 NOTE — Progress Notes (Signed)
Subjective:  Brittany Fowler is a 40 y.o. J4N8295 at [redacted]w[redacted]d being seen today for ongoing prenatal care.  She is currently monitored for the following issues for this high-risk pregnancy and has Gestational diabetes mellitus (GDM), antepartum; AMA (advanced maternal age) primigravida 45+; Supervision of high risk pregnancy, antepartum; Grand multiparity with current pregnancy, antepartum; and History of macrosomia in infant in prior pregnancy, currently pregnant on her problem list.  Patient reports no complaints.  Contractions: Not present. Vag. Bleeding: None.  Movement: Present. Denies leaking of fluid.   The following portions of the patient's history were reviewed and updated as appropriate: allergies, current medications, past family history, past medical history, past social history, past surgical history and problem list. Problem list updated.  Objective:   Filed Vitals:   03/08/15 0926  BP: 124/81  Pulse: 89  Temp: 98.2 F (36.8 C)  Weight: 241 lb 4.8 oz (109.453 kg)    Fetal Status: Fetal Heart Rate (bpm): 155 Fundal Height: 43 cm Movement: Present     General:  Alert, oriented and cooperative. Patient is in no acute distress.  Skin: Skin is warm and dry. No rash noted.   Cardiovascular: Normal heart rate noted  Respiratory: Normal respiratory effort, no problems with respiration noted  Abdomen: Soft, gravid, appropriate for gestational age. Pain/Pressure: Absent     Pelvic: Vag. Bleeding: None     Cervical exam deferred        Extremities: Normal range of motion.  Edema: None  Mental Status: Normal mood and affect. Normal behavior. Normal judgment and thought content.   Urinalysis: Urine Protein: 1+ Urine Glucose: Negative  Assessment and Plan:  Pregnancy: A2Z3086 at [redacted]w[redacted]d  1. Grand multiparity with current pregnancy, antepartum, third trimester   2. AMA (advanced maternal age) primigravida 35+, third trimester   3. Diet controlled gestational diabetes mellitus  (GDM), antepartum CBGs reviewed: all within range Growth ultrasound scheduled on 03/11/15  4. History of macrosomia in infant in prior pregnancy, currently pregnant, third trimester   5. Supervision of high risk pregnancy, antepartum, third trimester Patient informed me that she plans on delivering in [place] [facility] due to distance. Release of information signed today Patient advised to contact a physician in [place] to assume her care and schedule IOL at 40 weeks if not delivered. Patient verbalized understanding.  Term labor symptoms and general obstetric precautions including but not limited to vaginal bleeding, contractions, leaking of fluid and fetal movement were reviewed in detail with the patient. Please refer to After Visit Summary for other counseling recommendations.  Return in about 1 week (around 03/15/2015).   Catalina Antigua, MD

## 2015-03-08 NOTE — Progress Notes (Signed)
Educated pt on Rooming In  

## 2015-03-11 ENCOUNTER — Ambulatory Visit (HOSPITAL_COMMUNITY): Payer: Medicaid Other

## 2015-03-17 ENCOUNTER — Ambulatory Visit (HOSPITAL_COMMUNITY): Payer: Medicaid Other | Attending: Maternal and Fetal Medicine

## 2015-10-01 ENCOUNTER — Encounter (HOSPITAL_COMMUNITY): Payer: Self-pay

## 2017-03-19 ENCOUNTER — Encounter: Payer: Self-pay | Admitting: *Deleted

## 2017-03-19 ENCOUNTER — Telehealth: Payer: Self-pay

## 2017-03-19 LAB — BASIC METABOLIC PANEL: Glucose: 183

## 2017-03-19 LAB — OB RESULTS CONSOLE HGB/HCT, BLOOD
HEMATOCRIT: 34
HEMOGLOBIN: 11.6

## 2017-03-19 LAB — OB RESULTS CONSOLE PLATELET COUNT: PLATELETS: 222

## 2017-03-19 NOTE — Telephone Encounter (Signed)
Health department called to schedule patient for transfer of care for gestational diabetes.   Per caller patient is AMA and 14.5 weeks today. Armandina StammerJennifer Howard RNBSN

## 2017-04-05 ENCOUNTER — Ambulatory Visit (INDEPENDENT_AMBULATORY_CARE_PROVIDER_SITE_OTHER): Payer: Medicaid Other | Admitting: Family Medicine

## 2017-04-05 ENCOUNTER — Encounter: Payer: Self-pay | Admitting: Family Medicine

## 2017-04-05 VITALS — BP 114/75 | HR 81 | Wt 227.0 lb

## 2017-04-05 DIAGNOSIS — O0992 Supervision of high risk pregnancy, unspecified, second trimester: Secondary | ICD-10-CM

## 2017-04-05 DIAGNOSIS — O09513 Supervision of elderly primigravida, third trimester: Secondary | ICD-10-CM | POA: Diagnosis not present

## 2017-04-05 DIAGNOSIS — O09512 Supervision of elderly primigravida, second trimester: Secondary | ICD-10-CM

## 2017-04-05 DIAGNOSIS — O24419 Gestational diabetes mellitus in pregnancy, unspecified control: Secondary | ICD-10-CM

## 2017-04-05 DIAGNOSIS — O0990 Supervision of high risk pregnancy, unspecified, unspecified trimester: Secondary | ICD-10-CM

## 2017-04-05 DIAGNOSIS — O24410 Gestational diabetes mellitus in pregnancy, diet controlled: Secondary | ICD-10-CM | POA: Diagnosis not present

## 2017-04-05 IMAGING — US US MFM OB FOLLOW-UP
1 series · 12 of 28 positions shown · non-contrast
Comparison: none

[Series 1: us mfm ob follow-up · 0.19mm/px · 31 acquisitions, 12 frames shown]
[im 2/31]
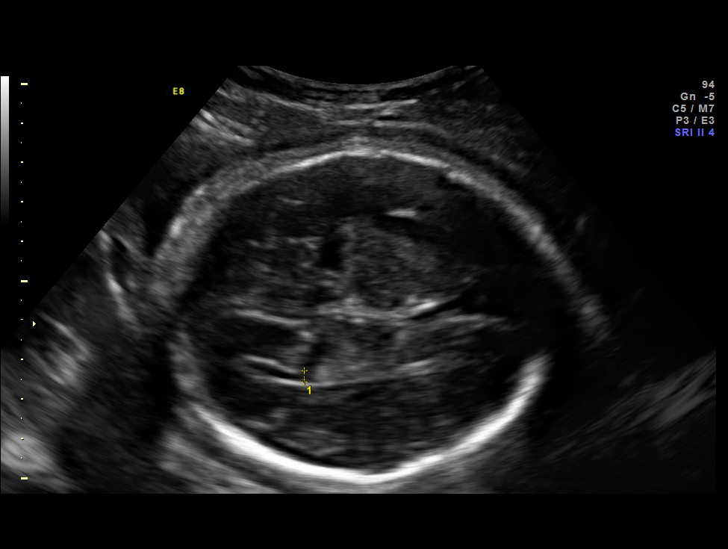
[im 4/31]
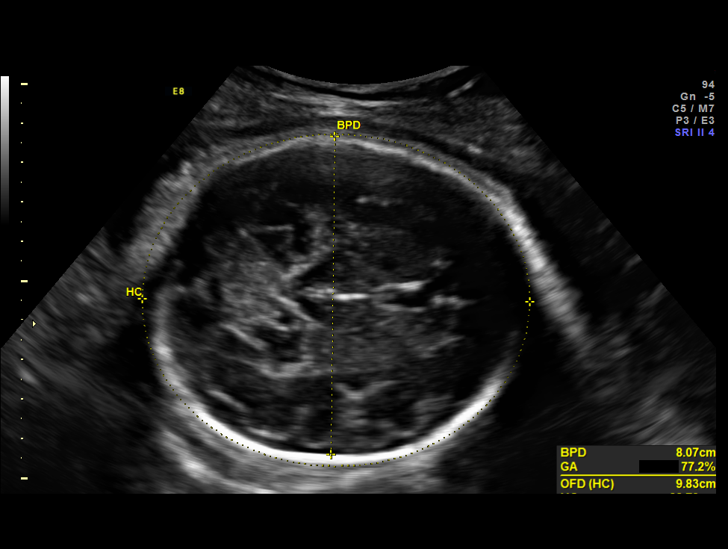
[im 6/31]
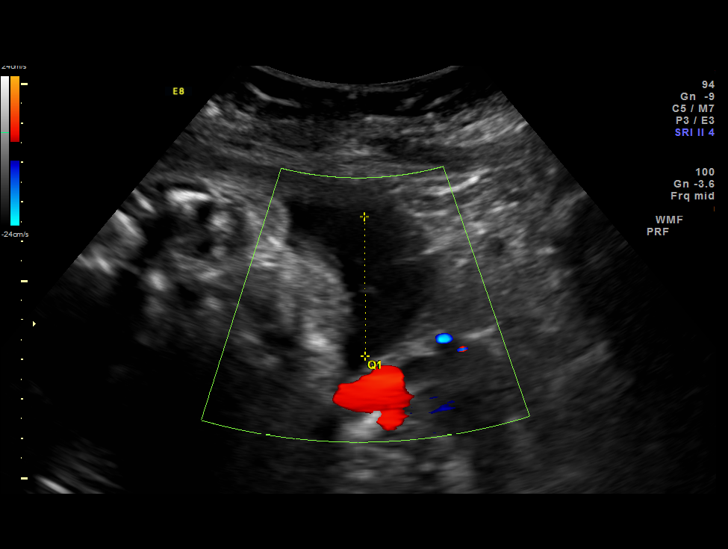
[im 9/31]
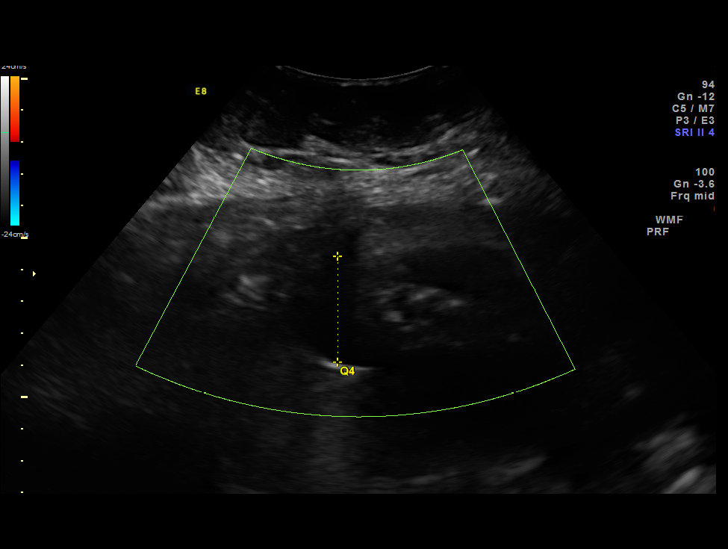
[im 12/31]
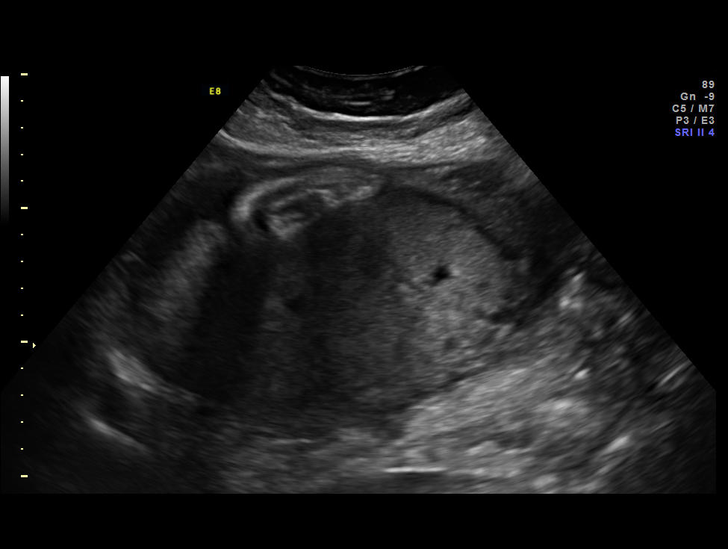
[im 14/31]
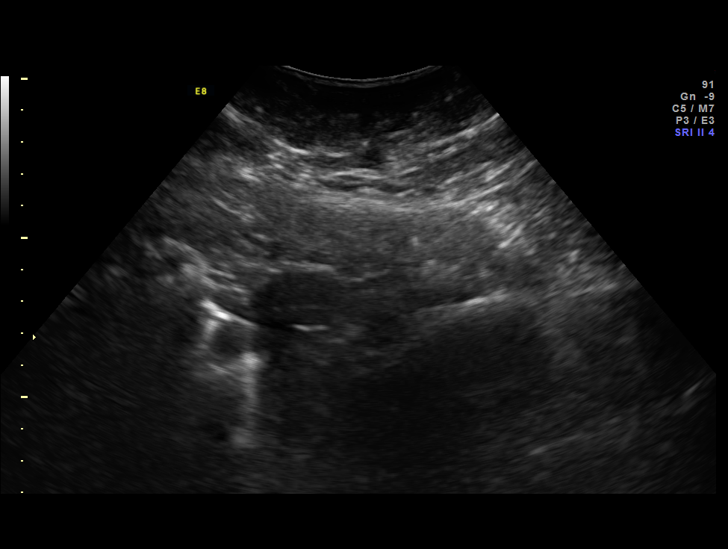
[im 17/31]
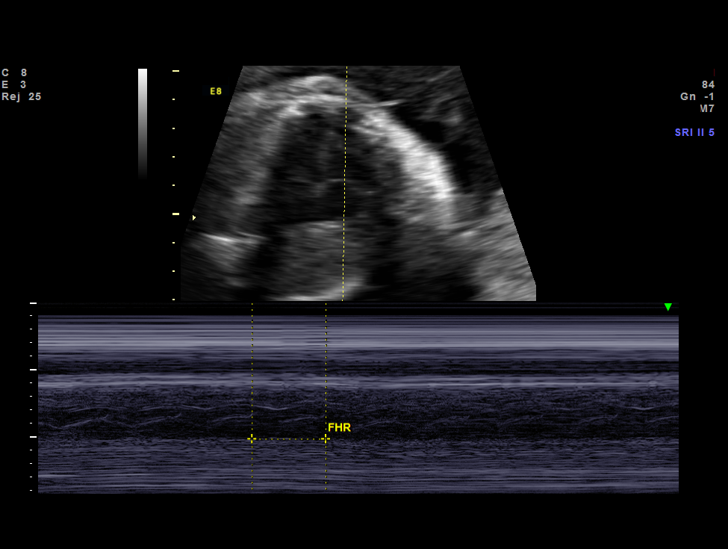
[im 19/31]
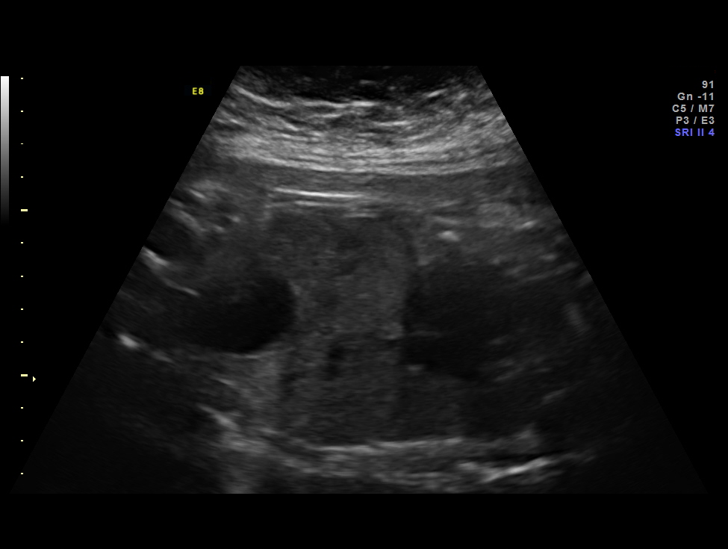
[im 22/31]
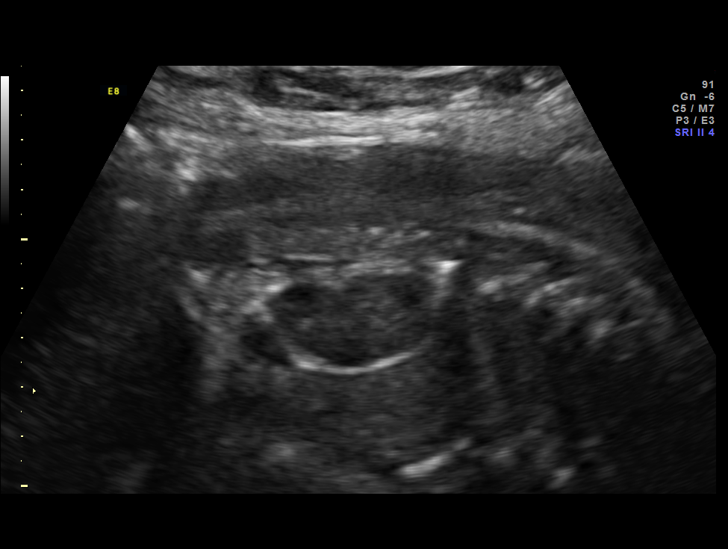
[im 25/31]
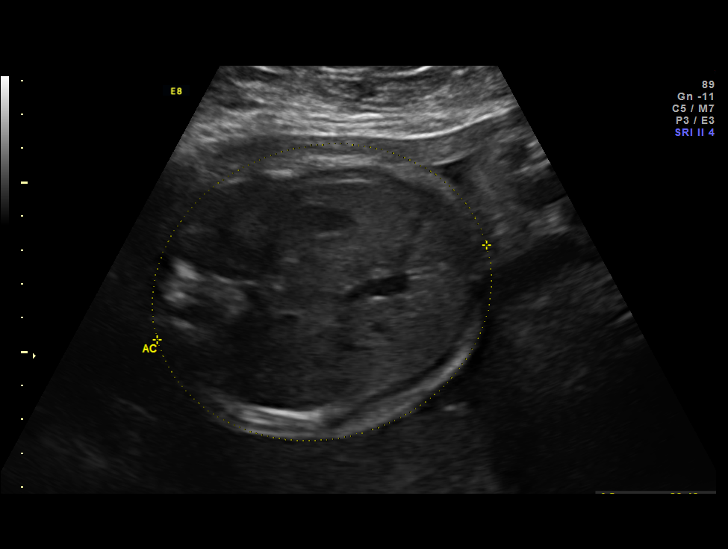
[im 27/31]
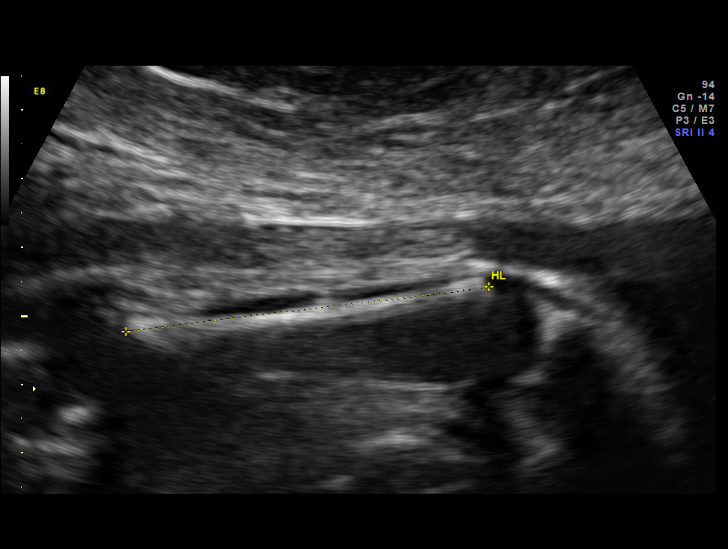
[im 29/31]
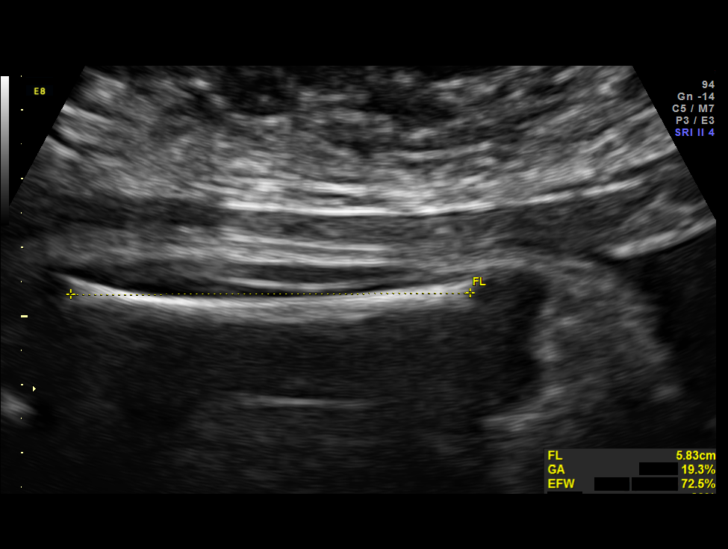

[12 of 28 positions shown; findings below may reference images not displayed]

OB/Gyn Clinic
[REDACTED]-
Faculty Physician

1  ROLANDO SEN             [id number]       [id number]     [id number]
Indications

Gestational diabetes in pregnancy, diet
controlled
Advanced maternal age multigravida 39,
third trimester - negative QUAD screen
Obesity complicating pregnancy, third
trimester
Abnormal biochemical finding on antenatal
screening of mother; elevated hCG (4.16
MoM); low risk for T[DATE]
31 weeks gestation of pregnancy
OB History

Gravidity:    8         Term:   6        Prem:   0         SAB:   1
TOP:          0       Ectopic:  0        Living: 6
Fetal Evaluation

Num Of Fetuses:     1
Fetal Heart         136
Rate(bpm):
Cardiac Activity:   Observed
Presentation:       Cephalic
Placenta:           Posterior, above cervical os
P. Cord Insertion:  Previously Visualized

Amniotic Fluid
AFI FV:      Subjectively within normal limits
AFI Sum:     13.75    cm      45  %Tile      Larg Pckt:   3.59  cm
RUQ:   3.54    cm   RLQ:    3.32   cm    LUQ:   3.59    cm    LLQ:   3.3     cm
Biometry

BPD:      81.2  mm     G. Age:  32w 4d                  CI:          81.3  %    70 - 86
OFD:      99.9  mm                                      FL/HC:       20.1  %    19.3 -
HC:        290  mm     G. Age:  31w 6d         35  %    HC/AC:       0.98       0.96 -
AC:      295.3  mm     G. Age:  33w 4d         96  %    FL/BPD:      71.7  %    71 - 87
FL:       58.2  mm     G. Age:  30w 3d         19  %    FL/AC:       19.7  %    20 - 24
HUM:      52.9  mm     G. Age:  30w 6d         46  %

Est. FW:    3778   gm     4 lb 5 oz     77  %
Gestational Age

LMP:           31w 1d        Date:  06/17/14                 EDD:    03/24/15
U/S Today:     32w 1d                                        EDD:    03/17/15
Best:          31w 1d     Det. By:  LMP  (06/17/14)          EDD:    03/24/15
Anatomy

Cranium:          Appears normal         Aortic Arch:      Previously seen
Fetal Cavum:      Appears normal         Ductal Arch:      Previously seen
Ventricles:       Appears normal         Diaphragm:        Previously seen
Choroid Plexus:   Previously seen        Stomach:          Appears normal, left
sided
Cerebellum:       Previously seen        Abdomen:          Appears normal
Posterior Fossa:  Previously seen        Abdominal Wall:   Previously seen
Nuchal Fold:      Not applicable (>20    Cord Vessels:     Previously seen
wks GA)
Face:             Orbits and profile     Kidneys:          Appear normal
previously seen
Lips:             Previously seen        Bladder:          Appears normal
Fetal Thoracic:   Appears normal         Spine:            Previously seen
Heart:            Appears normal         Upper             Previously seen
(4CH, axis, and        Extremities:
situs)
RVOT:             Previously seen        Lower             Previously seen
Extremities:
LVOT:             Previously seen

Other:  Female gender previously seen. Heels previously seen. Technically
difficult due to advanced GA and fetal position.
Cervix Uterus Adnexa

Cervix
Not visualized (advanced GA >39wks)

Adnexa:       No abnormality visualized.
Impression

Singleton intrauterine pregnancy at 31 weeks 1 day gestation
with fetal cardiac activity
Cephalic presentation
Normal appearing fetal anatomy and amniotic fluid volume
Appropriate interval growth with EFW at the 77th %tile
Posterior placenta without evidence of previa
Recommendations

Follow-up ultrasound for growth in 4-5 weeks (AMA/hCG/BMI)

## 2017-04-05 MED ORDER — ACCU-CHEK MULTICLIX LANCETS MISC: 12 refills | Status: AC

## 2017-04-05 MED ORDER — GLUCOSE BLOOD VI STRP: ORAL_STRIP | 12 refills | Status: AC

## 2017-04-05 MED ORDER — ACCU-CHEK NANO SMARTVIEW W/DEVICE KIT: 1.0000 | PACK | 0 refills | Status: AC

## 2017-04-05 NOTE — Progress Notes (Signed)
Transfer from health dept due to Gestational diabetes Last Pap:11-02-14 negative. Armandina StammerJennifer Wynonna Fitzhenry RN BSN

## 2017-04-05 NOTE — Progress Notes (Signed)
Subjective:  Brittany Fowler is a [id number] 46w1dbeing seen today for her first obstetrical visit.  Her obstetrical history is significant for advanced maternal age and gestational diabetes, obesity, h/o fetal macrosomia, grand multiparity.. Patient does intend to breast feed. Pregnancy history fully reviewed.  Patient reports no complaints.  BP 114/75   Pulse 81   Wt 227 lb (103 kg)   LMP 12/06/2016 (Exact Date)   BMI 42.89 kg/m   HISTORY: OB History  Gravida Para Term Preterm AB Living  _0 0 1 7  SAB TAB Ectopic Multiple Live Births  1 0 0   7    # Outcome Date GA Lbr Len/2nd Weight Sex Delivery Anes PTL Lv  9 Current           8 Term 03/17/15 362w0d F Vag-Spont  N LIV  7 SAB 05/2014          6 Term 11/04/12 4027w0d lb (4.082 kg) F Vag-Spont None N LIV     Birth Comments: states blood sugar was high at first , then normal   5 Term 09/18/09 40w23w0dlb (4.082 kg) M Vag-Spont None N LIV  4 Term 07/15/02 40w026w0db 6 oz (2.892 kg) M Vag-Spont None N LIV  3 Term 03/17/99 71w0d39w0d 8 oz (3.856 kg) F Vag-Spont None N LIV  2 Term 02/22/97 [redacted]w[redacted]d [redacted]w[redacted]d(3.175 kg) M Vag-Spont None N LIV  1 Term 08/06/94 [redacted]w[redacted]d  [redacted]w[redacted]d oz (3.374 kg) F Vag-Spont None N LIV    Past Medical History:  Diagnosis Date  . Vaginal Pap smear, abnormal    2011    Past Surgical History:  Procedure Laterality Date  . NO PAST SURGERIES      History reviewed. No pertinent family history.   Exam    Uterus:     Pelvic Exam: Done at HD  System: Breast:  Done at HD   Skin: normal coloration and turgor, no rashes    Neurologic: gait normal; reflexes normal and symmetric   Extremities: normal strength, tone, and muscle mass   HEENT PERRLA and extra ocular movement intact   Mouth/Teeth mucous membranes moist, pharynx normal without lesions   Neck supple and no masses   Cardiovascular: regular rate and rhythm, no murmurs or gallops   Respiratory:  appears well, vitals normal, no respiratory  distress, acyanotic, normal RR, ear and throat exam is normal, neck free of mass or lymphadenopathy, chest clear, no wheezing, crepitations, rhonchi, normal symmetric air entry   Abdomen: soft, non-tender; bowel sounds normal; no masses,  no organomegaly      Assessment:    Pregnancy: G9P7017 V2N1916 Active Problem List   Diagnosis Date Noted  . Grand [place] with current pregnancy, antepartum 02/22/2015  . History of macrosomia in infant in prior pregnancy, currently pregnant 02/22/2015  . Supervision of high risk pregnancy, antepartum 02/08/2015  . Gestational diabetes mellitus (GDM), antepartum 12/22/2014  . AMA (advanced maternal age) primigravida 35+ 12/22/2014      Plan:   1. Supervision of high risk pregnancy, antepartum FHT and FH normal. Pt states that she [facility] testing [facility] - US MFM [place] +14 WK; Future - Ambulatory referral to Nutrition and Diabetic Education - Hemoglobin A1c - TSH  2. Elderly primigravida in third trimester Testing at 32 weeks, induce at 40 weeks if not on medication for GDM  3. Diet  controlled gestational diabetes mellitus (GDM), antepartum Check HgA1c, TSH. If HgA1c elevated, will need ophthalmology referral and fetal echo. Start checking CBGs fasting and 2hr PP.  4. Gestational diabetes mellitus (GDM), antepartum, gestational diabetes method of control unspecified - [place] MFM OB DETAIL +14 WK; Future - Ambulatory referral to Nutrition and Diabetic Education - Hemoglobin A1c - TSH - Referral to Nutrition and Diabetes Services - Blood Glucose Monitoring Suppl (ACCU-CHEK NANO SMARTVIEW) w/Device KIT; 1 kit by Subdermal route as directed. Check blood sugars for fasting, and two hours after breakfast, lunch and dinner (4 checks daily)  Dispense: 1 kit; Refill: 0 - Lancets (ACCU-CHEK MULTICLIX) lancets; GDM O24.419 for testing 4 times daily  Dispense: 100 each; Refill: 12 - glucose blood (ACCU-CHEK SMARTVIEW)  test strip; DX Gestational Diabetes O24.419 for testing 4 times daily  Dispense: 100 each; Refill: 12     Problem list reviewed and updated. 75% of 45 min visit spent on counseling and coordination of care.     Truett Mainland 04/05/2017

## 2017-04-06 LAB — HEMOGLOBIN A1C
ESTIMATED AVERAGE GLUCOSE: 131 mg/dL
HEMOGLOBIN A1C: 6.2 % — AB (ref 4.8–5.6)

## 2017-04-06 LAB — TSH: TSH: 2.5 u[IU]/mL (ref 0.450–4.500)

## 2017-04-16 ENCOUNTER — Encounter (HOSPITAL_COMMUNITY): Payer: Self-pay

## 2017-04-16 ENCOUNTER — Other Ambulatory Visit (HOSPITAL_COMMUNITY): Payer: Self-pay | Admitting: *Deleted

## 2017-04-16 ENCOUNTER — Other Ambulatory Visit: Payer: Self-pay | Admitting: Family Medicine

## 2017-04-16 ENCOUNTER — Ambulatory Visit (HOSPITAL_COMMUNITY)
Admission: RE | Admit: 2017-04-16 | Discharge: 2017-04-16 | Disposition: A | Discharge status: Home / Self Care | Payer: Medicaid Other | Source: Ambulatory Visit | Attending: Family Medicine | Admitting: Family Medicine

## 2017-04-16 DIAGNOSIS — Z3689 Encounter for other specified antenatal screening: Secondary | ICD-10-CM

## 2017-04-16 DIAGNOSIS — O09522 Supervision of elderly multigravida, second trimester: Secondary | ICD-10-CM

## 2017-04-16 DIAGNOSIS — O09292 Supervision of pregnancy with other poor reproductive or obstetric history, second trimester: Secondary | ICD-10-CM | POA: Diagnosis not present

## 2017-04-16 DIAGNOSIS — O24419 Gestational diabetes mellitus in pregnancy, unspecified control: Secondary | ICD-10-CM

## 2017-04-16 DIAGNOSIS — O0990 Supervision of high risk pregnancy, unspecified, unspecified trimester: Secondary | ICD-10-CM

## 2017-04-16 DIAGNOSIS — O99212 Obesity complicating pregnancy, second trimester: Secondary | ICD-10-CM | POA: Insufficient documentation

## 2017-04-16 DIAGNOSIS — O24410 Gestational diabetes mellitus in pregnancy, diet controlled: Secondary | ICD-10-CM

## 2017-04-16 DIAGNOSIS — Z3A22 22 weeks gestation of pregnancy: Secondary | ICD-10-CM | POA: Diagnosis not present

## 2017-04-16 DIAGNOSIS — Z3A18 18 weeks gestation of pregnancy: Secondary | ICD-10-CM

## 2017-04-18 ENCOUNTER — Ambulatory Visit: Payer: Medicaid Other

## 2017-04-23 ENCOUNTER — Ambulatory Visit: Payer: Medicaid Other | Admitting: Registered"

## 2017-04-27 ENCOUNTER — Encounter: Payer: Medicaid Other | Attending: Family Medicine | Admitting: Registered"

## 2017-04-27 ENCOUNTER — Encounter: Payer: Self-pay | Admitting: Registered"

## 2017-04-27 DIAGNOSIS — O24419 Gestational diabetes mellitus in pregnancy, unspecified control: Secondary | ICD-10-CM | POA: Insufficient documentation

## 2017-04-27 DIAGNOSIS — Z713 Dietary counseling and surveillance: Secondary | ICD-10-CM | POA: Diagnosis not present

## 2017-04-27 DIAGNOSIS — O24410 Gestational diabetes mellitus in pregnancy, diet controlled: Secondary | ICD-10-CM

## 2017-04-27 NOTE — Progress Notes (Signed)
Patient was seen on 04/27/2017 for Gestational Diabetes self-management education at [facility]. The following learning objectives were met by the patient during this course:   States the definition of Gestational Diabetes  States why dietary management is important in controlling blood glucose  Describes the effects each nutrient has on blood glucose levels  Demonstrates ability to create a balanced meal plan  Demonstrates carbohydrate counting   States when to check blood glucose levels  Demonstrates proper blood glucose monitoring techniques  States the effect of stress and exercise on blood glucose levels  States the importance of limiting caffeine and abstaining from alcohol and smoking  Blood glucose monitor given: Patient brought accu-chek nano to appointment  Patient instructed to monitor glucose levels: FBS: 60 - <95; 1 hour: <140; 2 hour: <120  Patient received handouts:  Nutrition Diabetes and Pregnancy, including carb counting list, fish guide  Patient will be seen for follow-up as needed. 

## 2017-05-03 IMAGING — US US MFM OB FOLLOW-UP
1 series · 14 of 28 positions shown · non-contrast
Comparison: none

[Series 1: us mfm ob follow-up · 14 of 28 slices shown]
[im 2/28]
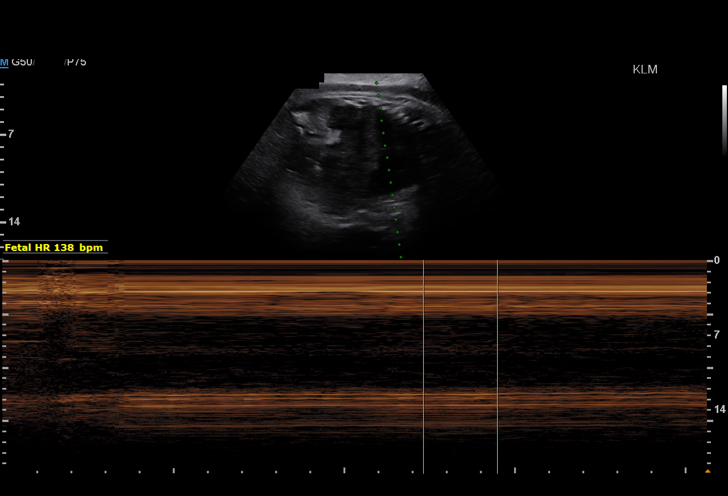
[im 4/28]
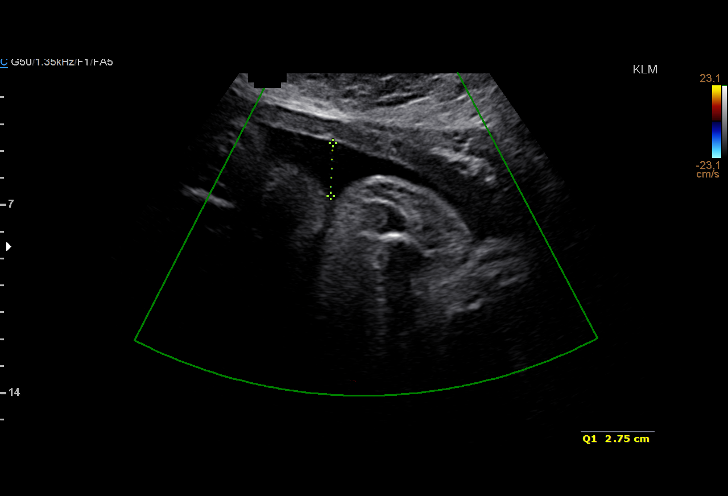
[im 6/28]
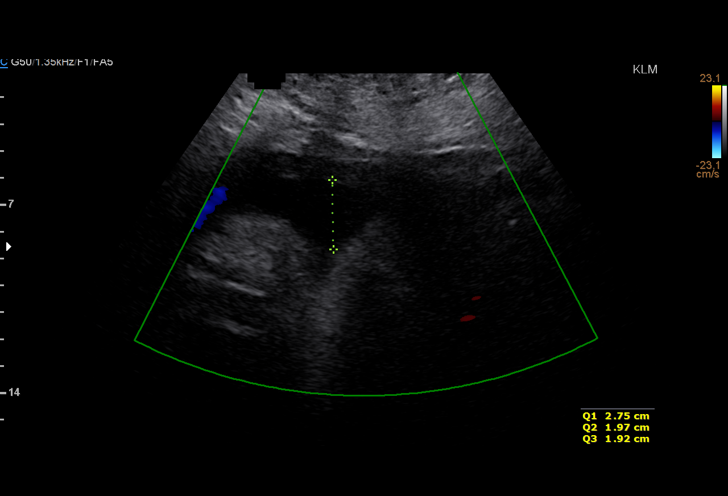
[im 8/28]
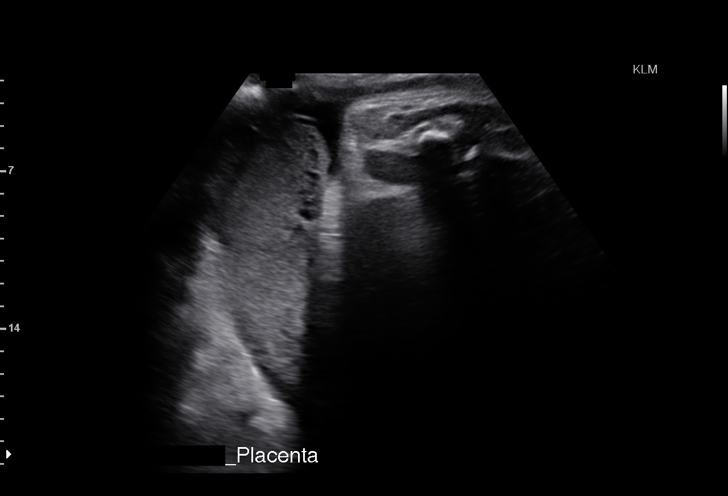
[im 10/28]
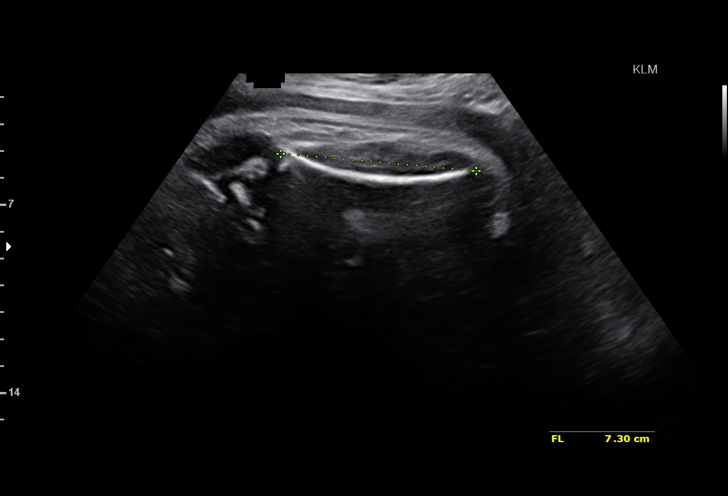
[im 12/28]
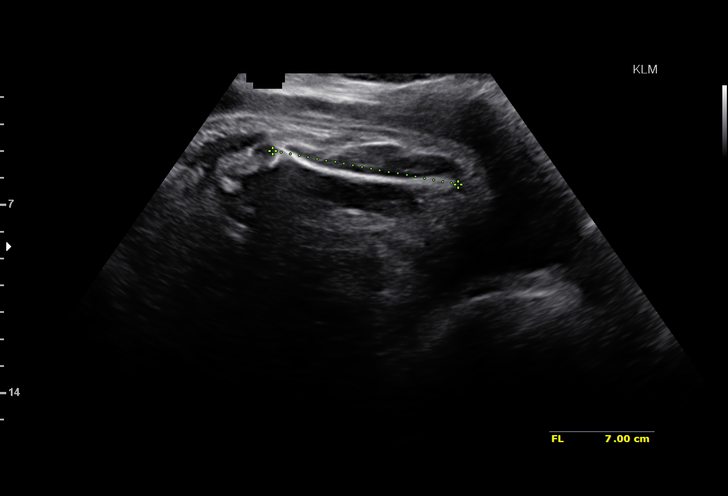
[im 14/28]
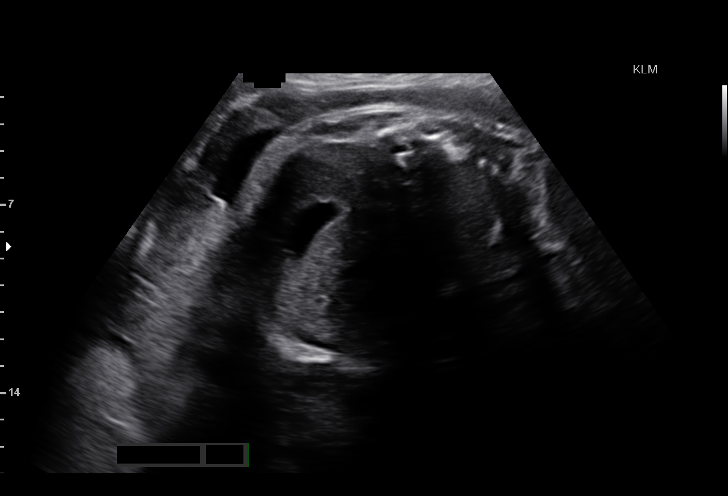
[im 16/28]
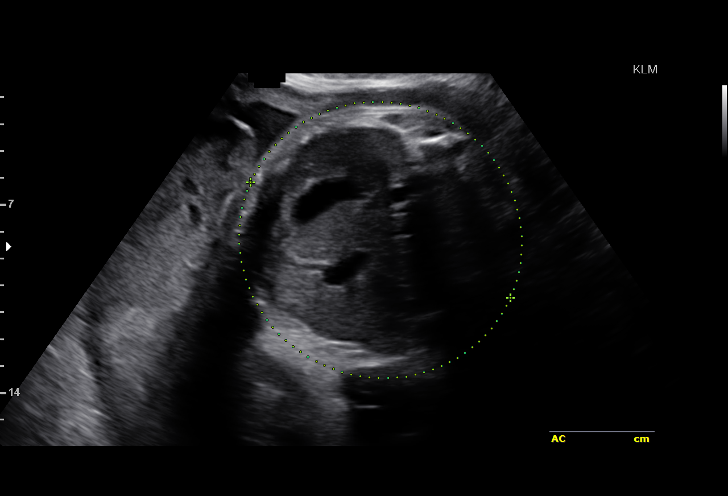
[im 18/28]
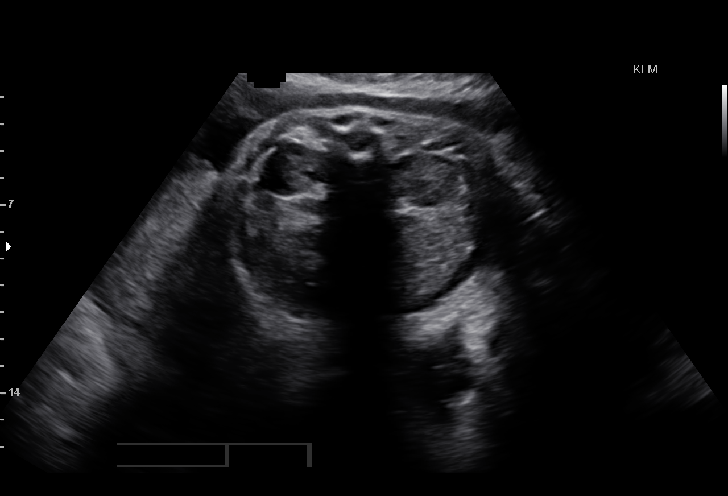
[im 20/28]
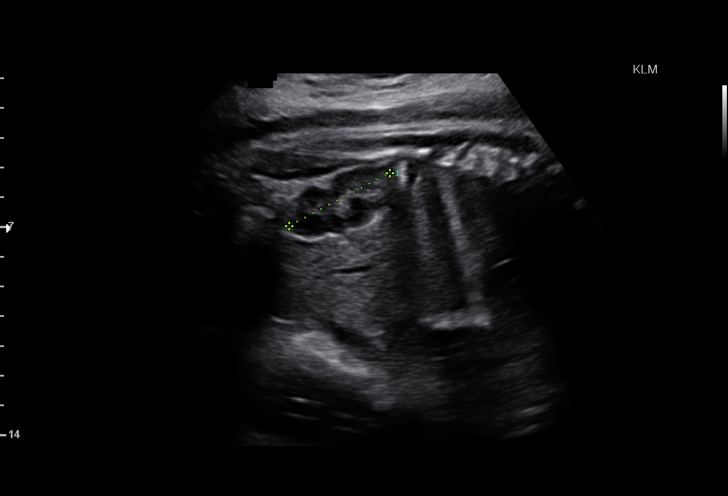
[im 22/28]
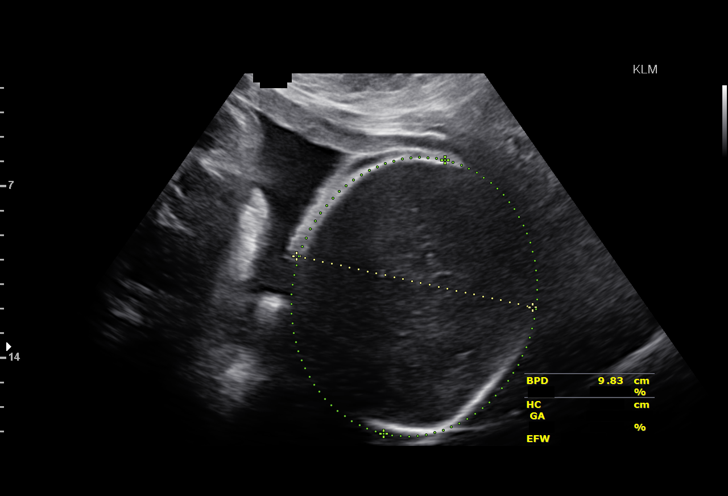
[im 24/28]
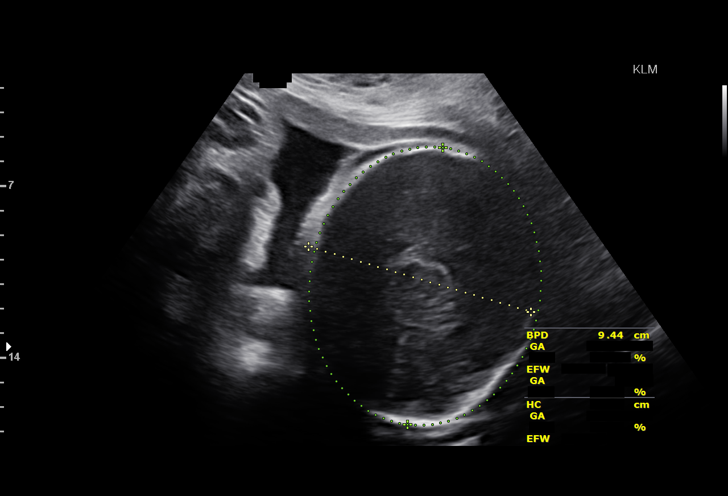
[im 26/28]
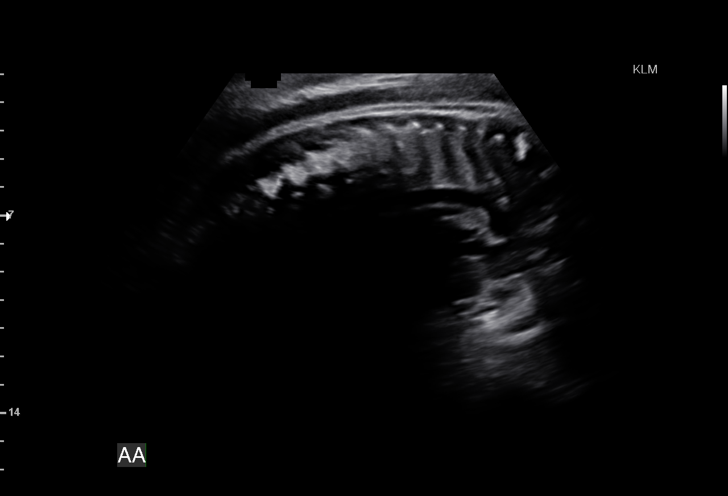
[im 28/28]
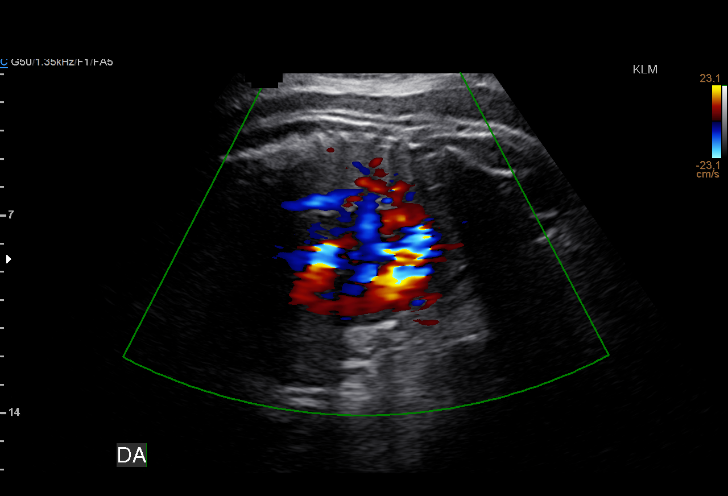

[14 of 28 positions shown; findings below may reference images not displayed]

am)

Name:       EDVIN HENG                  Visit  02/18/2015 [DATE]
Date:

Health
Department [REDACTED][REDACTED]-
Faculty Physician
Hospital OB/Gyn
Clinic

1  HOBER GIL            [id number]        [id number]     [id number]
Indications

Gestational diabetes in pregnancy, diet
controlled
Advanced maternal age multigravida 39,
third trimester - negative QUAD screen
Obesity complicating pregnancy, third
trimester
Abnormal biochemical finding on antenatal
screening of mother; elevated hCG (4.16
MoM); low risk for T[DATE]
35 weeks gestation of pregnancy
OB History

Gravidity:     8         Term:  6        Prem:    0        SAB:   1
TOP:           0       Ectopic  0        Living:  6
:
Fetal Evaluation

Num Of Fetuses:      1
Fetal Heart          138
Rate(bpm):
Cardiac Activity:    Observed
Presentation:        Cephalic
Placenta:            Posterior, above cervical os
P. Cord Insertion:   Previously Visualized

Amniotic Fluid
AFI FV:      Subjectively within normal limits
AFI Sum:     9.22     cm      15  %Tile     Larg Pckt:    2.75   cm
RUQ:   2.75    cm    RLQ:   2.58    cm   LUQ:    1.97    cm   LLQ:    1.92   cm
Biometry

BPD:      95.9  mm     G. Age:   39w 1d                  CI:        82.17   %    70 - 86
FL/HC:      21.1   %    20.1 -
HC:      333.8  mm     G. Age:   38w 1d        85   %    HC/AC:      0.99        0.93 -
AC:      337.1  mm     G. Age:   37w 4d      > 97   %    FL/BPD      73.4   %    71 - 87
:
FL:       70.4  mm     G. Age:   36w 1d        68   %    FL/AC:      20.9   %    20 - 24

Est.        1552   gm    7 lb 2 oz    > 90   %
FW:
Gestational Age

LMP:           35w 1d        Date:  06/17/14                  EDD:   03/24/15
U/S Today:     37w 5d                                         EDD:   03/06/15
Best:          35w 1d    Det. By:   LMP  (06/17/14)           EDD:   03/24/15
Anatomy

Cranium:          Appears normal         Aortic Arch:       Previously seen
Fetal Cavum:      Previously seen        Ductal Arch:       Previously seen
Ventricles:       Previously seen        Diaphragm:         Previously seen
Choroid Plexus:   Previously seen        Stomach:           Appears normal,
left sided
Cerebellum:       Previously seen        Abdomen:           Appears normal
Posterior         Previously seen        Abdominal          Previously seen
Fossa:                                   Wall:
Nuchal Fold:      Not applicable (>20    Cord Vessels:      Previously seen
wks GA)
Face:             Orbits and profile     Kidneys:           Appear normal
previously seen
Lips:             Previously seen        Bladder:           Appears normal
Fetal Thoracic:   Appears normal         Spine:             Previously seen
Heart:            Previously seen        Upper              Previously seen
Extremities:
RVOT:             Previously seen        Lower              Previously seen
Extremities:
LVOT:             Previously seen

Other:   Female gender previously seen. Heels previously seen. Technically
difficult due to advanced GA and fetal position.
Cervix Uterus Adnexa

Cervix
Not visualized (advanced GA >93wks)
Impression

Single IUP at 35w 1d
A2 GDM - patient reports that she is not taking her
Glyburide
The estimated fetal weight is > 90th %tile; the AC is > 97th
%tile.
Posterior placenta without previa
Normal amniotic fluid volume
Recommendations

Recommend 2x weekly NSTS with weekly AFIs
Ultrasound for growth in 3 weeks if undelivered
Delivery at 39 weeks (A2 GDM)

## 2017-05-07 ENCOUNTER — Encounter: Payer: Self-pay | Admitting: Obstetrics & Gynecology

## 2017-05-07 ENCOUNTER — Ambulatory Visit (INDEPENDENT_AMBULATORY_CARE_PROVIDER_SITE_OTHER): Payer: Medicaid Other | Admitting: Obstetrics & Gynecology

## 2017-05-07 VITALS — BP 120/74 | HR 67 | Wt 227.0 lb

## 2017-05-07 DIAGNOSIS — O09299 Supervision of pregnancy with other poor reproductive or obstetric history, unspecified trimester: Secondary | ICD-10-CM

## 2017-05-07 DIAGNOSIS — O09512 Supervision of elderly primigravida, second trimester: Secondary | ICD-10-CM

## 2017-05-07 DIAGNOSIS — O0940 Supervision of pregnancy with grand multiparity, unspecified trimester: Secondary | ICD-10-CM

## 2017-05-07 DIAGNOSIS — O0990 Supervision of high risk pregnancy, unspecified, unspecified trimester: Secondary | ICD-10-CM

## 2017-05-07 DIAGNOSIS — O24410 Gestational diabetes mellitus in pregnancy, diet controlled: Secondary | ICD-10-CM

## 2017-05-07 NOTE — Progress Notes (Signed)
   PRENATAL VISIT NOTE  Subjective:  Brittany Fowler is a 42 y.o. U9W1191G9P7017 at 2875w5d being seen today for ongoing prenatal care.  She is currently monitored for the following issues for this high-risk pregnancy and has Gestational diabetes mellitus (GDM), antepartum; AMA (advanced maternal age) primigravida 235+; Supervision of high risk pregnancy, antepartum; Grand multiparity with current pregnancy, antepartum; and History of macrosomia in infant in prior pregnancy, currently pregnant on their problem list.  Patient reports no complaints.  Contractions: Not present. Vag. Bleeding: None.  Movement: Present. Denies leaking of fluid.   The following portions of the patient's history were reviewed and updated as appropriate: allergies, current medications, past family history, past medical history, past social history, past surgical history and problem list. Problem list updated.  Objective:   Vitals:   05/07/17 0823  BP: 120/74  Pulse: 67  Weight: 227 lb (103 kg)    Fetal Status:     Movement: Present     General:  Alert, oriented and cooperative. Patient is in no acute distress.  Skin: Skin is warm and dry. No rash noted.   Cardiovascular: Normal heart rate noted  Respiratory: Normal respiratory effort, no problems with respiration noted  Abdomen: Soft, gravid, appropriate for gestational age.  Pain/Pressure: Present     Pelvic: Cervical exam deferred        Extremities: Normal range of motion.  Edema: None  Mental Status: Normal mood and affect. Normal behavior. Normal judgment and thought content.   Assessment and Plan:  Pregnancy: Y7W2956G9P7017 at 7475w5d  1. Supervision of high risk pregnancy, antepartum  2. History of macrosomia in infant in prior pregnancy, currently pregnant Has f/u US ordered. Pt measures S>D but, it may be due to body habitus.   3. Grand multiparity with current pregnancy, antepartum Her last 3 pregnancies were complicated with DM but, she never required  meds.   4. Diet controlled gestational diabetes mellitus (GDM), antepartum Only 1 glc results was abnormal 1 baby ASA was added today  5. AMA  Preterm labor symptoms and general obstetric precautions including but not limited to vaginal bleeding, contractions, leaking of fluid and fetal movement were reviewed in detail with the patient. Please refer to After Visit Summary for other counseling recommendations.  Return in about 1 month (around 06/04/2017).  Future Appointments  Date Time Provider Department Center  05/14/2017  8:00 AM [facility] US 3 WH-[facility]US [facility]-US   Swahili video interpreter used. The family realized that they knew him personally. Neither party had any conflict     Willodean Rosenthalarolyn Harraway-Smith, MD

## 2017-05-08 LAB — CBC
HEMOGLOBIN: 11.3 g/dL (ref 11.1–15.9)
Hematocrit: 33.6 % — ABNORMAL LOW (ref 34.0–46.6)
MCH: 28.9 pg (ref 26.6–33.0)
MCHC: 33.6 g/dL (ref 31.5–35.7)
MCV: 86 fL (ref 79–97)
Platelets: 203 10*3/uL (ref 150–379)
RBC: 3.91 x10E6/uL (ref 3.77–5.28)
RDW: 15 % (ref 12.3–15.4)
WBC: 5.6 10*3/uL (ref 3.4–10.8)

## 2017-05-08 LAB — COMPREHENSIVE METABOLIC PANEL
ALT: 14 IU/L (ref 0–32)
AST: 10 IU/L (ref 0–40)
Albumin/Globulin Ratio: 1.5 (ref 1.2–2.2)
Albumin: 3.5 g/dL (ref 3.5–5.5)
Alkaline Phosphatase: 46 IU/L (ref 39–117)
BUN/Creatinine Ratio: 9 (ref 9–23)
BUN: 4 mg/dL — ABNORMAL LOW (ref 6–24)
Bilirubin Total: <0.2 mg/dL (ref 0.0–1.2)
CALCIUM: 8.2 mg/dL — AB (ref 8.7–10.2)
CO2: 19 mmol/L — AB (ref 20–29)
CREATININE: 0.47 mg/dL — AB (ref 0.57–1.00)
Chloride: 107 mmol/L — ABNORMAL HIGH (ref 96–106)
GFR calc Af Amer: 142 mL/min/{1.73_m2} (ref >59)
GFR, EST NON AFRICAN AMERICAN: 123 mL/min/{1.73_m2} (ref >59)
GLOBULIN, TOTAL: 2.3 g/dL (ref 1.5–4.5)
Glucose: 109 mg/dL — ABNORMAL HIGH (ref 65–99)
Potassium: 4 mmol/L (ref 3.5–5.2)
SODIUM: 139 mmol/L (ref 134–144)
TOTAL PROTEIN: 5.8 g/dL — AB (ref 6.0–8.5)

## 2017-05-08 LAB — PROTEIN / CREATININE RATIO, URINE: Creatinine, Urine: 7.3 mg/dL

## 2017-05-09 ENCOUNTER — Encounter: Payer: Self-pay | Admitting: Obstetrics & Gynecology

## 2017-05-09 DIAGNOSIS — O99019 Anemia complicating pregnancy, unspecified trimester: Secondary | ICD-10-CM | POA: Insufficient documentation

## 2017-05-14 ENCOUNTER — Encounter (HOSPITAL_COMMUNITY): Payer: Self-pay

## 2017-05-14 ENCOUNTER — Ambulatory Visit (HOSPITAL_COMMUNITY)
Admission: RE | Admit: 2017-05-14 | Discharge: 2017-05-14 | Disposition: A | Discharge status: Home / Self Care | Payer: Medicaid Other | Source: Ambulatory Visit | Attending: Obstetrics & Gynecology | Admitting: Obstetrics & Gynecology

## 2017-05-14 ENCOUNTER — Other Ambulatory Visit (HOSPITAL_COMMUNITY): Payer: Self-pay | Admitting: *Deleted

## 2017-05-14 ENCOUNTER — Other Ambulatory Visit (HOSPITAL_COMMUNITY): Payer: Self-pay | Admitting: Obstetrics and Gynecology

## 2017-05-14 DIAGNOSIS — Z362 Encounter for other antenatal screening follow-up: Secondary | ICD-10-CM | POA: Insufficient documentation

## 2017-05-14 DIAGNOSIS — O24410 Gestational diabetes mellitus in pregnancy, diet controlled: Secondary | ICD-10-CM

## 2017-05-14 DIAGNOSIS — O99212 Obesity complicating pregnancy, second trimester: Secondary | ICD-10-CM

## 2017-05-14 DIAGNOSIS — O09522 Supervision of elderly multigravida, second trimester: Secondary | ICD-10-CM | POA: Diagnosis not present

## 2017-05-14 DIAGNOSIS — Z0373 Encounter for suspected fetal anomaly ruled out: Secondary | ICD-10-CM

## 2017-05-14 DIAGNOSIS — O24419 Gestational diabetes mellitus in pregnancy, unspecified control: Secondary | ICD-10-CM | POA: Diagnosis not present

## 2017-05-14 DIAGNOSIS — Z3A26 26 weeks gestation of pregnancy: Secondary | ICD-10-CM

## 2017-05-14 DIAGNOSIS — O09299 Supervision of pregnancy with other poor reproductive or obstetric history, unspecified trimester: Secondary | ICD-10-CM

## 2017-05-14 DIAGNOSIS — O09292 Supervision of pregnancy with other poor reproductive or obstetric history, second trimester: Secondary | ICD-10-CM | POA: Insufficient documentation

## 2017-05-15 ENCOUNTER — Telehealth: Payer: Self-pay

## 2017-05-15 MED ORDER — FERROUS SULFATE 325 (65 FE) MG PO TABS: ORAL_TABLET | ORAL | 3 refills | Status: AC

## 2017-05-15 NOTE — Telephone Encounter (Signed)
-----   Message from Willodean Rosenthal, MD sent at 05/09/2017 11:22 AM EDT ----- Please call pt. She needs to begin FeSO4 bid on an empty stomach.  Thx, clh-S

## 2017-05-15 NOTE — Telephone Encounter (Signed)
Patient husband Sharlet Salina made aware that patient should take iron twice a day on empty stomach because her iron levels are low.  Sent in script to pharmacy. He states understanding and will pick up prescription later today. Armandina Stammer RN

## 2017-06-05 ENCOUNTER — Encounter: Payer: Medicaid Other | Admitting: Advanced Practice Midwife

## 2017-06-08 ENCOUNTER — Ambulatory Visit (INDEPENDENT_AMBULATORY_CARE_PROVIDER_SITE_OTHER): Payer: Medicaid Other | Admitting: Advanced Practice Midwife

## 2017-06-08 ENCOUNTER — Encounter: Payer: Self-pay | Admitting: Advanced Practice Midwife

## 2017-06-08 ENCOUNTER — Other Ambulatory Visit (HOSPITAL_COMMUNITY): Payer: Self-pay | Admitting: *Deleted

## 2017-06-08 ENCOUNTER — Ambulatory Visit (HOSPITAL_COMMUNITY)
Admission: RE | Admit: 2017-06-08 | Discharge: 2017-06-08 | Disposition: A | Discharge status: Home / Self Care | Payer: Medicaid Other | Source: Ambulatory Visit | Attending: Family Medicine | Admitting: Family Medicine

## 2017-06-08 ENCOUNTER — Other Ambulatory Visit (HOSPITAL_COMMUNITY): Payer: Self-pay | Admitting: Maternal and Fetal Medicine

## 2017-06-08 ENCOUNTER — Encounter (HOSPITAL_COMMUNITY): Payer: Self-pay

## 2017-06-08 DIAGNOSIS — O24410 Gestational diabetes mellitus in pregnancy, diet controlled: Secondary | ICD-10-CM

## 2017-06-08 DIAGNOSIS — O99213 Obesity complicating pregnancy, third trimester: Secondary | ICD-10-CM | POA: Insufficient documentation

## 2017-06-08 DIAGNOSIS — O09293 Supervision of pregnancy with other poor reproductive or obstetric history, third trimester: Secondary | ICD-10-CM | POA: Diagnosis not present

## 2017-06-08 DIAGNOSIS — O09299 Supervision of pregnancy with other poor reproductive or obstetric history, unspecified trimester: Secondary | ICD-10-CM

## 2017-06-08 DIAGNOSIS — Z3A29 29 weeks gestation of pregnancy: Secondary | ICD-10-CM

## 2017-06-08 DIAGNOSIS — Z362 Encounter for other antenatal screening follow-up: Secondary | ICD-10-CM | POA: Diagnosis not present

## 2017-06-08 DIAGNOSIS — O09523 Supervision of elderly multigravida, third trimester: Secondary | ICD-10-CM | POA: Diagnosis not present

## 2017-06-08 DIAGNOSIS — O99019 Anemia complicating pregnancy, unspecified trimester: Secondary | ICD-10-CM

## 2017-06-08 NOTE — Progress Notes (Signed)
   PRENATAL VISIT NOTE  Subjective:  Kemba HamenyiKaena Santori [id number] at [redacted]w[redacted]d being seen today for ongoing prenatal care.  She is currently monitored for the following issues for this high-risk pregnancy and has Diet controlled gestational diabetes mellitus (GDM) in third trimester; AMA (advanced maternal age) primigravida 48+; Supervision of high risk pregnancy, antepartum; Grand multiparity with current pregnancy, antepartum; Prior fetal macrosomia, antepartum; Anemia, antepartum; [redacted] weeks gestation of pregnancy; Encounter for other antenatal screening follow-up; AMA (advanced maternal age) multigravida 35+, third trimester; and Obesity affecting pregnancy in third trimester on their problem list.  Patient reports no complaints.  Contractions: Not present. Vag. Bleeding: None.  Movement: Present. Denies leaking of fluid.   The following portions of the patient's history were reviewed and updated as appropriate: allergies, current medications, past family history, past medical history, past social history, past surgical history and problem list. Problem list updated.  Refused interpretor (ipad)  Objective:   Vitals:   06/08/17 1035  BP: (!) 104/52  Pulse: 71  Weight: 225 lb (102.1 kg)    Fetal Status: Fetal Heart Rate (bpm): 146   Movement: Present     General:  Alert, oriented and cooperative. Patient is in no acute distress.  Skin: Skin is warm and dry. No rash noted.   Cardiovascular: Normal heart rate noted  Respiratory: Normal respiratory effort, no problems with respiration noted  Abdomen: Soft, gravid, appropriate for gestational age.  Pain/Pressure: Absent     Pelvic: Cervical exam deferred        Extremities: Normal range of motion.  Edema: None  Mental Status: Normal mood and affect. Normal behavior. Normal judgment and thought content.   [place] reviewed from today AFI normal Growth 60%ile LLat Post placenta Dr Marjo Bicker recommends another [place] in 4 weeks  Assessment  and Plan:  Pregnancy: [id number] at [redacted]w[redacted]d  1. Diet controlled gestational diabetes mellitus (GDM) in third trimester     Brought book      All FBS under 90.  All postprandial values were under 120. There was one value of 120.       Had every day filled in.   Preterm labor symptoms and general obstetric precautions including but not limited to vaginal bleeding, contractions, leaking of fluid and fetal movement were reviewed in detail with the patient. Please refer to After Visit Summary for other counseling recommendations.   RTO 2 weeks.  Pt states cannot come in 2 weeks but will come in 4 weeks.  FOB states he will be out of town and cannot bring her in 2 weeks.   Future Appointments  Date Time Provider Department Center  07/12/2017  8:45 AM Levie Heritage, DO [facility] None  07/12/2017  9:00 AM [facility] [place] 3 [facility]    Wynelle Bourgeois, CNM

## 2017-06-08 NOTE — ED Notes (Signed)
Language Resources interpreter with patient.

## 2017-06-08 NOTE — Patient Instructions (Signed)
Second Trimester of Pregnancy The second trimester is from week 13 through week 28, month 4 through 6. This is often the time in pregnancy that you feel your best. Often times, morning sickness has lessened or quit. You may have more energy, and you may get hungry more often. Your unborn baby (fetus) is growing rapidly. At the end of the sixth month, he or she is about 9 inches long and weighs about 1 pounds. You will likely feel the baby move (quickening) between 18 and 20 weeks of pregnancy. Follow these instructions at home:  Avoid all smoking, herbs, and alcohol. Avoid drugs not approved by your doctor.  Do not use any tobacco products, including cigarettes, chewing tobacco, and electronic cigarettes. If you need help quitting, ask your doctor. You may get counseling or other support to help you quit.  Only take medicine as told by your doctor. Some medicines are safe and some are not during pregnancy.  Exercise only as told by your doctor. Stop exercising if you start having cramps.  Eat regular, healthy meals.  Wear a good support bra if your breasts are tender.  Do not use hot tubs, steam rooms, or saunas.  Wear your seat belt when driving.  Avoid raw meat, uncooked cheese, and liter boxes and soil used by cats.  Take your prenatal vitamins.  Take 1500-2000 milligrams of calcium daily starting at the 20th week of pregnancy until you deliver your baby.  Try taking medicine that helps you poop (stool softener) as needed, and if your doctor approves. Eat more fiber by eating fresh fruit, vegetables, and whole grains. Drink enough fluids to keep your pee (urine) clear or pale yellow.  Take warm water baths (sitz baths) to soothe pain or discomfort caused by hemorrhoids. Use hemorrhoid cream if your doctor approves.  If you have puffy, bulging veins (varicose veins), wear support hose. Raise (elevate) your feet for 15 minutes, 3-4 times a day. Limit salt in your diet.  Avoid heavy  lifting, wear low heals, and sit up straight.  Rest with your legs raised if you have leg cramps or low back pain.  Visit your dentist if you have not gone during your pregnancy. Use a soft toothbrush to brush your teeth. Be gentle when you floss.  You can have sex (intercourse) unless your doctor tells you not to.  Go to your doctor visits. Get help if:  You feel dizzy.  You have mild cramps or pressure in your lower belly (abdomen).  You have a nagging pain in your belly area.  You continue to feel sick to your stomach (nauseous), throw up (vomit), or have watery poop (diarrhea).  You have bad smelling fluid coming from your vagina.  You have pain with peeing (urination). Get help right away if:  You have a fever.  You are leaking fluid from your vagina.  You have spotting or bleeding from your vagina.  You have severe belly cramping or pain.  You lose or gain weight rapidly.  You have trouble catching your breath and have chest pain.  You notice sudden or extreme puffiness (swelling) of your face, hands, ankles, feet, or legs.  You have not felt the baby move in over an hour.  You have severe headaches that do not go away with medicine.  You have vision changes. This information is not intended to replace advice given to you by your health care provider. Make sure you discuss any questions you have with your health care   provider. Document Released: 03/29/2009 Document Revised: 06/10/2015 Document Reviewed: 03/05/2012 Elsevier Interactive Patient Education  2017 [employer] Inc[employer]  

## 2017-07-12 ENCOUNTER — Ambulatory Visit (INDEPENDENT_AMBULATORY_CARE_PROVIDER_SITE_OTHER): Payer: Medicaid Other | Admitting: Family Medicine

## 2017-07-12 ENCOUNTER — Ambulatory Visit (HOSPITAL_COMMUNITY)
Admission: RE | Admit: 2017-07-12 | Discharge: 2017-07-12 | Disposition: A | Discharge status: Home / Self Care | Payer: Medicaid Other | Source: Ambulatory Visit | Attending: Family Medicine | Admitting: Family Medicine

## 2017-07-12 VITALS — BP 113/75 | HR 84 | Wt 227.0 lb

## 2017-07-12 DIAGNOSIS — O0990 Supervision of high risk pregnancy, unspecified, unspecified trimester: Secondary | ICD-10-CM

## 2017-07-12 DIAGNOSIS — O0940 Supervision of pregnancy with grand multiparity, unspecified trimester: Secondary | ICD-10-CM

## 2017-07-12 DIAGNOSIS — O09299 Supervision of pregnancy with other poor reproductive or obstetric history, unspecified trimester: Secondary | ICD-10-CM

## 2017-07-12 DIAGNOSIS — O24410 Gestational diabetes mellitus in pregnancy, diet controlled: Secondary | ICD-10-CM

## 2017-07-12 DIAGNOSIS — O09523 Supervision of elderly multigravida, third trimester: Secondary | ICD-10-CM

## 2017-07-12 DIAGNOSIS — O99213 Obesity complicating pregnancy, third trimester: Secondary | ICD-10-CM

## 2017-07-12 NOTE — Progress Notes (Signed)
   PRENATAL VISIT NOTE  Subjective:  Brittany Fowler is a 42 y.o. [id number] at 7557w1d being seen today for ongoing prenatal care.  She is currently monitored for the following issues for this high-risk pregnancy and has Diet controlled gestational diabetes mellitus (GDM) in third trimester; Supervision of high risk pregnancy, antepartum; Grand multiparity with current pregnancy, antepartum; Prior fetal macrosomia, antepartum; Anemia, antepartum; AMA (advanced maternal age) multigravida 35+, third trimester; and Obesity affecting pregnancy in third trimester on their problem list.  Patient reports no complaints.  Contractions: Not present. Vag. Bleeding: None.  Movement: Present. Denies leaking of fluid.   The following portions of the patient's history were reviewed and updated as appropriate: allergies, current medications, past family history, past medical history, past social history, past surgical history and problem list. Problem list updated.  Objective:   Vitals:   07/12/17 0836  BP: 113/75  Pulse: 84  Weight: 227 lb (103 kg)    Fetal Status: Fetal Heart Rate (bpm): 145   Movement: Present     General:  Alert, oriented and cooperative. Patient is in no acute distress.  Skin: Skin is warm and dry. No rash noted.   Cardiovascular: Normal heart rate noted  Respiratory: Normal respiratory effort, no problems with respiration noted  Abdomen: Soft, gravid, appropriate for gestational age.  Pain/Pressure: Present     Pelvic: Cervical exam deferred        Extremities: Normal range of motion.  Edema: None  Mental Status: Normal mood and affect. Normal behavior. Normal judgment and thought content.   Assessment and Plan:  Pregnancy: [id number] at 6857w1d  1. Supervision of high risk pregnancy, antepartum FHT normal. S/D discrepancy - h/o macrosomia in addition to body habitus. Discussed contraception - pt not interested in Tubal ligation at this point.  2. Grand multiparity with  current pregnancy, antepartum  3. Diet controlled gestational diabetes mellitus (GDM) in third trimester All values within range  4. AMA (advanced maternal age) multigravida 35+, third trimester Antenatal testing at 36 weeks F/u US next week.  5. Prior fetal macrosomia, antepartum Proven pelvis to 9#  6. Obesity affecting pregnancy in third trimester   Preterm labor symptoms and general obstetric precautions including but not limited to vaginal bleeding, contractions, leaking of fluid and fetal movement were reviewed in detail with the patient. Please refer to After Visit Summary for other counseling recommendations.  No follow-ups on file.  Future Appointments  Date Time Provider Department Center  07/17/2017  2:30 PM [facility] US 1 [facility]    Levie HeritageJacob J Stinson, DO

## 2017-07-17 ENCOUNTER — Encounter (HOSPITAL_COMMUNITY): Payer: Self-pay

## 2017-07-17 ENCOUNTER — Other Ambulatory Visit (HOSPITAL_COMMUNITY): Payer: Self-pay | Admitting: Obstetrics and Gynecology

## 2017-07-17 ENCOUNTER — Ambulatory Visit (HOSPITAL_COMMUNITY)
Admission: RE | Admit: 2017-07-17 | Discharge: 2017-07-17 | Disposition: A | Discharge status: Home / Self Care | Payer: Medicaid Other | Source: Ambulatory Visit | Attending: Obstetrics and Gynecology | Admitting: Obstetrics and Gynecology

## 2017-07-17 DIAGNOSIS — Z3A35 35 weeks gestation of pregnancy: Secondary | ICD-10-CM | POA: Diagnosis not present

## 2017-07-17 DIAGNOSIS — Z362 Encounter for other antenatal screening follow-up: Secondary | ICD-10-CM | POA: Diagnosis not present

## 2017-07-17 DIAGNOSIS — Z3A31 31 weeks gestation of pregnancy: Secondary | ICD-10-CM

## 2017-07-17 DIAGNOSIS — Z363 Encounter for antenatal screening for malformations: Secondary | ICD-10-CM | POA: Diagnosis not present

## 2017-07-17 DIAGNOSIS — O24410 Gestational diabetes mellitus in pregnancy, diet controlled: Secondary | ICD-10-CM

## 2017-07-17 DIAGNOSIS — O99019 Anemia complicating pregnancy, unspecified trimester: Secondary | ICD-10-CM

## 2017-07-17 DIAGNOSIS — O99213 Obesity complicating pregnancy, third trimester: Secondary | ICD-10-CM | POA: Diagnosis not present

## 2017-07-18 ENCOUNTER — Other Ambulatory Visit (HOSPITAL_COMMUNITY): Payer: Self-pay | Admitting: *Deleted

## 2017-07-18 DIAGNOSIS — O24410 Gestational diabetes mellitus in pregnancy, diet controlled: Secondary | ICD-10-CM

## 2017-07-30 ENCOUNTER — Encounter: Payer: Self-pay | Admitting: Obstetrics & Gynecology

## 2017-07-30 ENCOUNTER — Ambulatory Visit (INDEPENDENT_AMBULATORY_CARE_PROVIDER_SITE_OTHER): Payer: Medicaid Other | Admitting: Obstetrics & Gynecology

## 2017-07-30 VITALS — BP 126/88 | HR 78 | Wt 227.0 lb

## 2017-07-30 DIAGNOSIS — O99019 Anemia complicating pregnancy, unspecified trimester: Secondary | ICD-10-CM

## 2017-07-30 DIAGNOSIS — O09299 Supervision of pregnancy with other poor reproductive or obstetric history, unspecified trimester: Secondary | ICD-10-CM

## 2017-07-30 DIAGNOSIS — O0990 Supervision of high risk pregnancy, unspecified, unspecified trimester: Secondary | ICD-10-CM

## 2017-07-30 DIAGNOSIS — O09293 Supervision of pregnancy with other poor reproductive or obstetric history, third trimester: Secondary | ICD-10-CM

## 2017-07-30 DIAGNOSIS — O0993 Supervision of high risk pregnancy, unspecified, third trimester: Secondary | ICD-10-CM

## 2017-07-30 DIAGNOSIS — O99213 Obesity complicating pregnancy, third trimester: Secondary | ICD-10-CM

## 2017-07-30 DIAGNOSIS — O24410 Gestational diabetes mellitus in pregnancy, diet controlled: Secondary | ICD-10-CM

## 2017-07-30 DIAGNOSIS — O09523 Supervision of elderly multigravida, third trimester: Secondary | ICD-10-CM

## 2017-07-30 DIAGNOSIS — O0943 Supervision of pregnancy with grand multiparity, third trimester: Secondary | ICD-10-CM | POA: Diagnosis not present

## 2017-07-30 DIAGNOSIS — O0940 Supervision of pregnancy with grand multiparity, unspecified trimester: Secondary | ICD-10-CM

## 2017-07-30 DIAGNOSIS — O99013 Anemia complicating pregnancy, third trimester: Secondary | ICD-10-CM

## 2017-07-30 NOTE — Progress Notes (Signed)
PRENATAL VISIT NOTE  Subjective:  Brittany Fowler is a 42 y.o. [id number] at 1929w5d being seen today for ongoing prenatal care.  She is currently monitored for the following issues for this high-risk pregnancy and has Diet controlled gestational diabetes mellitus (GDM) in third trimester; Supervision of high risk pregnancy, antepartum; Grand multiparity with current pregnancy, antepartum; Prior fetal macrosomia, antepartum; Anemia, antepartum; AMA (advanced maternal age) multigravida 35+, third trimester; and Obesity affecting pregnancy in third trimester on their problem list.  Patient reports no complaints.  Contractions: Not present. Vag. Bleeding: None.  Movement: Present. Denies leaking of fluid.   The following portions of the patient's history were reviewed and updated as appropriate: allergies, current medications, past family history, past medical history, past social history, past surgical history and problem list. Problem list updated.  Objective:   Vitals:   07/30/17 0916  BP: 126/88  Pulse: 78  Weight: 227 lb (103 kg)    Fetal Status: Fetal Heart Rate (bpm): 140   Movement: Present     General:  Alert, oriented and cooperative. Patient is in no acute distress.  Skin: Skin is warm and dry. No rash noted.   Cardiovascular: Normal heart rate noted  Respiratory: Normal respiratory effort, no problems with respiration noted  Abdomen: Soft, gravid, appropriate for gestational age.  Pain/Pressure: Absent     Pelvic: Cervical exam deferred        Extremities: Normal range of motion.  Edema: None  Mental Status: Normal mood and affect. Normal behavior. Normal judgment and thought content.   Assessment and Plan:  Pregnancy: [id number] at 5329w5d  1. Supervision of high risk pregnancy, antepartum  2. Prior fetal macrosomia, antepartum Est. FW:    2693   gm    5 lb 15 oz     64  %  3. Obesity affecting pregnancy in third trimester See above  4. Grand multiparity with current  pregnancy, antepartum Pt mentioned today that she [facility] and wants to deliver there. She is adamant that she does not want to deliver in [place].  I have explained to her using the [facility] interpreter that she should get her care by the team that plan to deliver her. She would like to be [facility].    5. Diet controlled gestational diabetes mellitus (GDM) in third trimester Impression  Gestational diabetes. Well-controlled on diet.  Fetal growth is appropriate for gestational age. Amniotic fluid  is normal. Incidental BPP is 8/8.  We reassured the patient of the findings.  I recommended weekly antenatal testing from next week.  Patient informed that she lives far away and will not be able  to come her. ---------------------------------------------------------------------- Recommendations  An appointment was made for her to return in 4 weeks for  fetal growth assessment and BPP.Impression  Gestational diabetes. Well-controlled on diet.  Fetal growth is appropriate for gestational age. Amniotic fluid  is normal. Incidental BPP is 8/8.  We reassured the patient of the findings.  I recommended weekly antenatal testing from next week.  Patient informed that she lives far away and will not be able  to come her. ---------------------------------------------------------------------- Recommendations  An appointment was made for her to return in 4 weeks for  fetal growth assessment and BPP.   NST reviewed and reactive today.  6. Anemia, antepartum  7. AMA (advanced maternal age) multigravida 35+, third trimester Begin antenatal testing today. Pt is >40 years.  Preterm labor symptoms and general obstetric precautions including but not limited to vaginal bleeding, contractions, leaking of fluid and fetal movement were reviewed in detail with the patient. Please refer to After Visit Summary for other counseling  recommendations.  Return in about 2 weeks (around 08/13/2017).  Future Appointments  Date Time Provider Department Center  08/14/2017 10:00 AM [facility] [place] 3 [facility]    Willodean Rosenthal, MD

## 2017-07-30 NOTE — Progress Notes (Signed)
Interpreter [phone] used for visit. Armandina StammerJennifer Govani Radloff RN

## 2017-07-31 ENCOUNTER — Telehealth: Payer: Self-pay

## 2017-07-31 LAB — HIV ANTIBODY (ROUTINE TESTING W REFLEX): HIV Screen 4th Generation wRfx: NONREACTIVE

## 2017-07-31 LAB — RPR: RPR Ser Ql: NONREACTIVE

## 2017-07-31 NOTE — Telephone Encounter (Signed)
Dr. Tawni Levyorn's office called and stated that they have accepted the transfer per patient request and she will have next appointment on 08/14/17 with Dr. Shawnie Ponsorn. Armandina StammerJennifer Crockett Rallo RN

## 2017-08-02 ENCOUNTER — Other Ambulatory Visit: Payer: Medicaid Other

## 2017-08-06 ENCOUNTER — Other Ambulatory Visit: Payer: Medicaid Other

## 2017-08-07 ENCOUNTER — Other Ambulatory Visit: Payer: Medicaid Other

## 2017-08-09 ENCOUNTER — Other Ambulatory Visit: Payer: Medicaid Other

## 2017-08-09 ENCOUNTER — Telehealth: Payer: Self-pay

## 2017-08-09 NOTE — Telephone Encounter (Signed)
Attempted to reach patient about missed NST and AFi appts. Armandina StammerJennifer Cruzita Lipa RN

## 2017-08-14 ENCOUNTER — Encounter (HOSPITAL_COMMUNITY): Payer: Self-pay

## 2017-08-14 ENCOUNTER — Ambulatory Visit (HOSPITAL_COMMUNITY)
Admission: RE | Admit: 2017-08-14 | Discharge: 2017-08-14 | Disposition: A | Discharge status: Home / Self Care | Payer: Medicaid Other | Source: Ambulatory Visit | Attending: Obstetrics & Gynecology | Admitting: Obstetrics & Gynecology

## 2017-08-14 ENCOUNTER — Other Ambulatory Visit (HOSPITAL_COMMUNITY): Payer: Self-pay | Admitting: *Deleted

## 2017-12-05 ENCOUNTER — Encounter (HOSPITAL_COMMUNITY): Payer: Self-pay

## 2019-07-28 IMAGING — US US MFM OB FOLLOW-UP
1 series · 14 of 28 positions shown · non-contrast
Comparison: none

[Series 1: us mfm ob follow-up · 41 acquisitions, 14 frames shown]
[im 2/41]
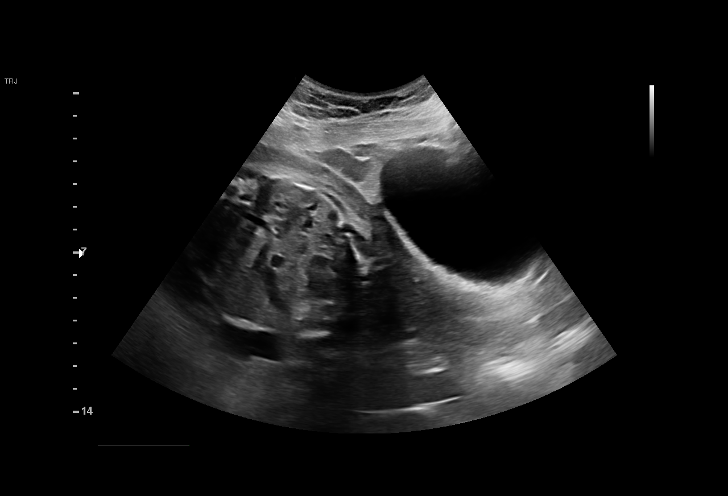
[im 5/41]
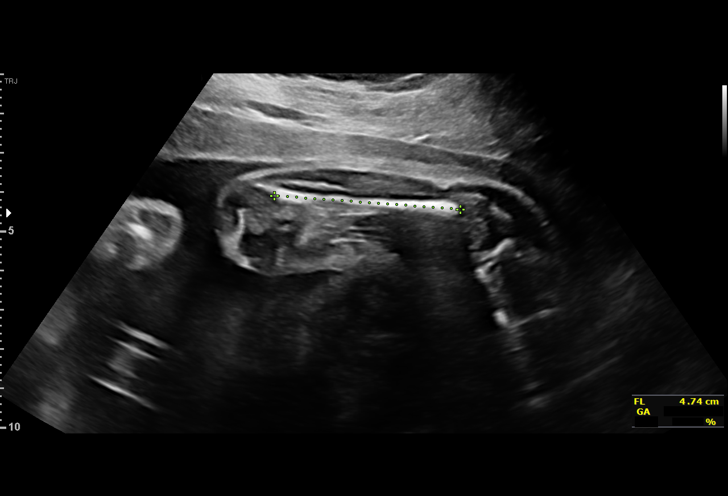
[im 8/41]
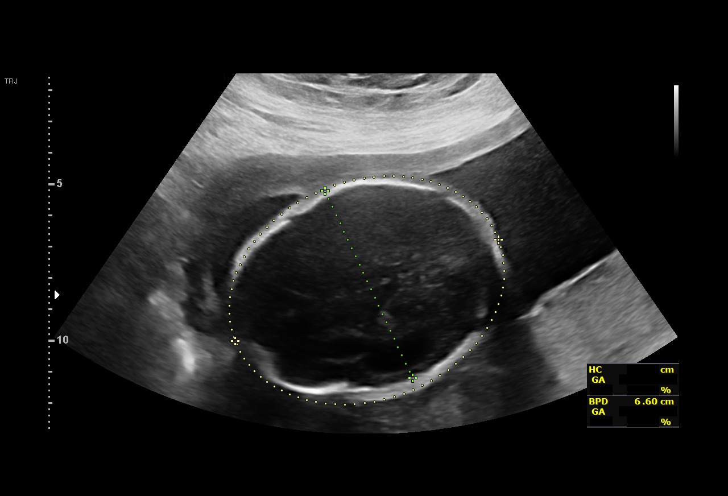
[im 11/41]
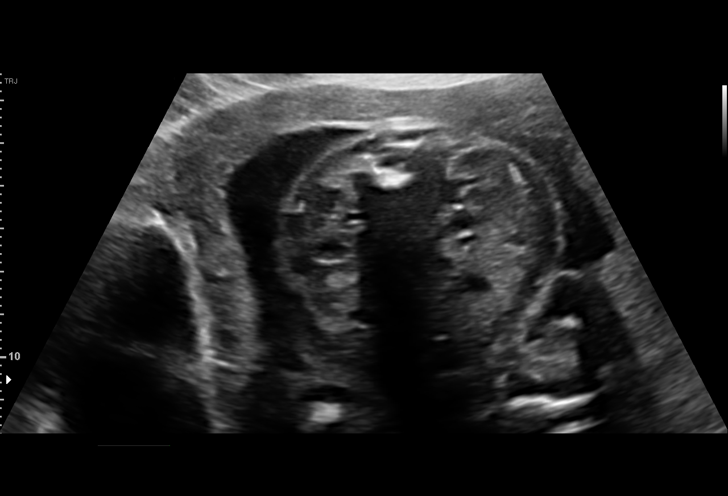
[im 14/41]
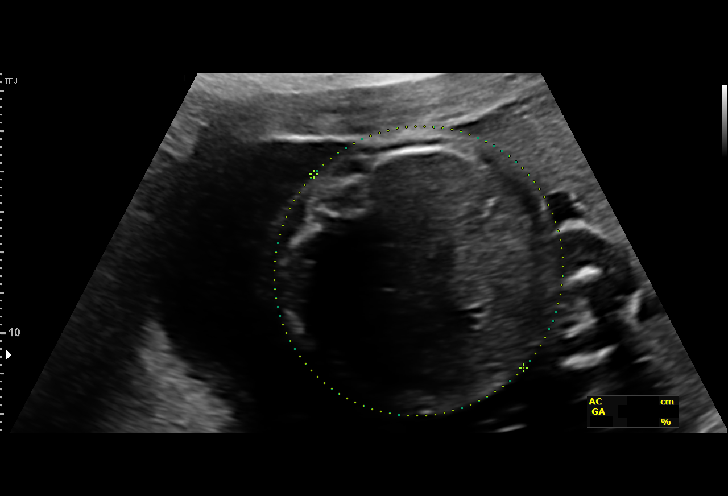
[im 17/41]
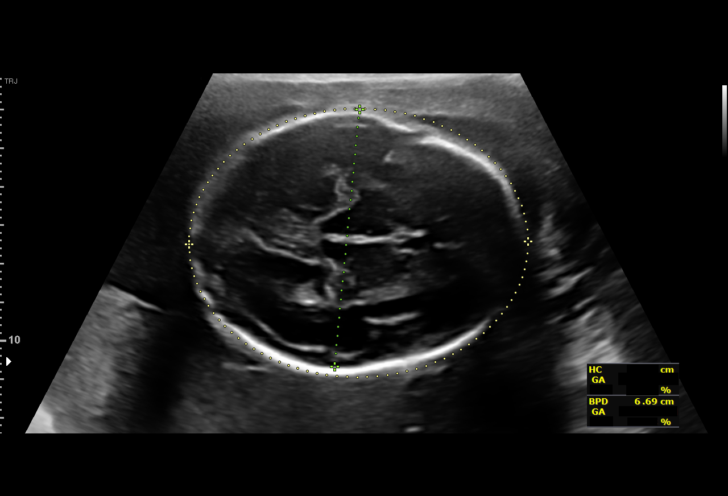
[im 20/41]
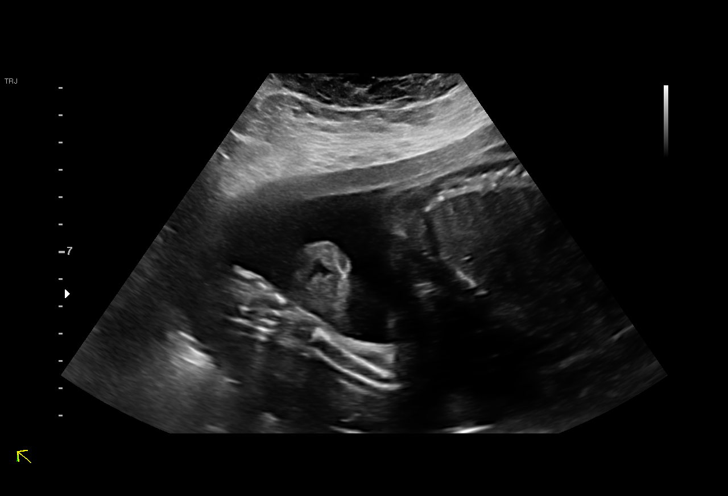
[im 23/41]
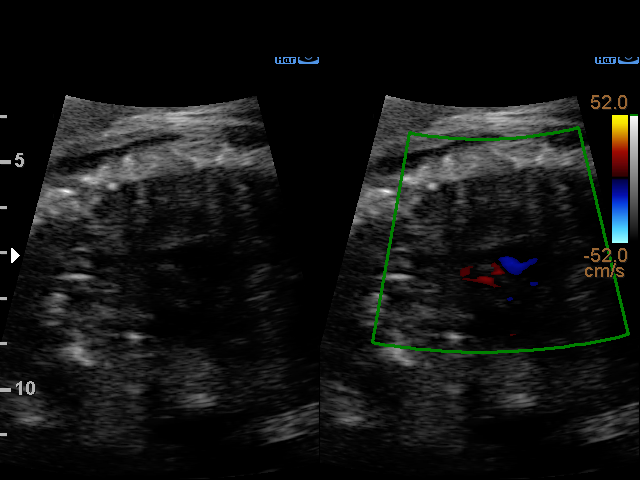
[im 26/41]
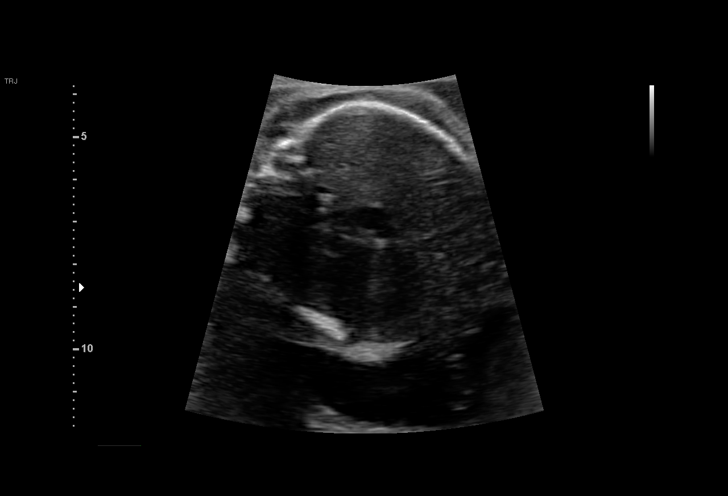
[im 29/41]
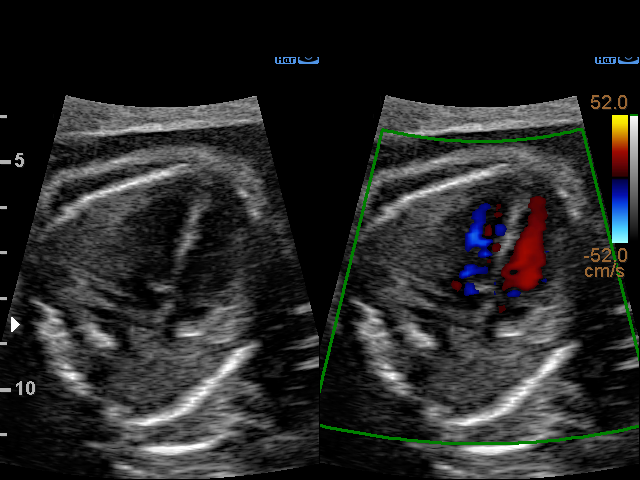
[im 32/41]
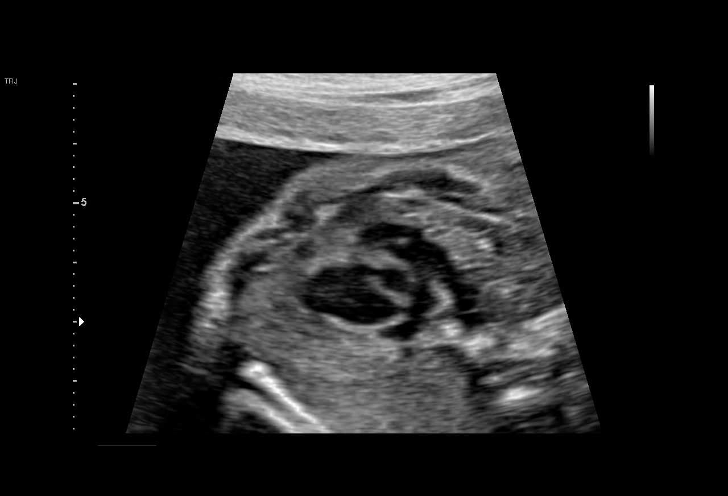
[im 35/41]
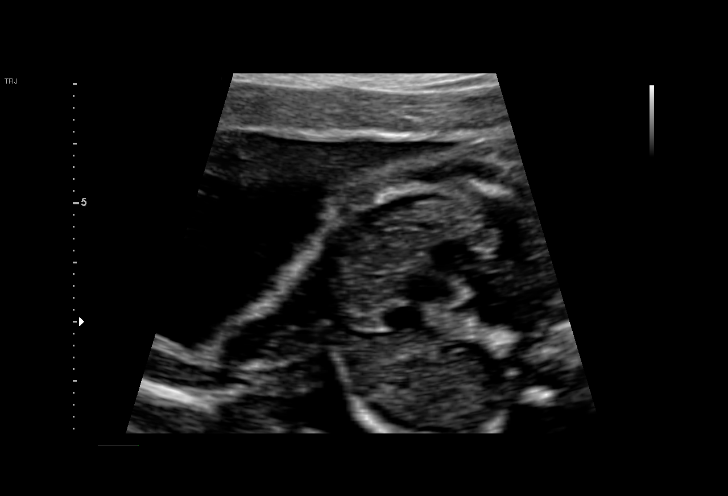
[im 38/41]
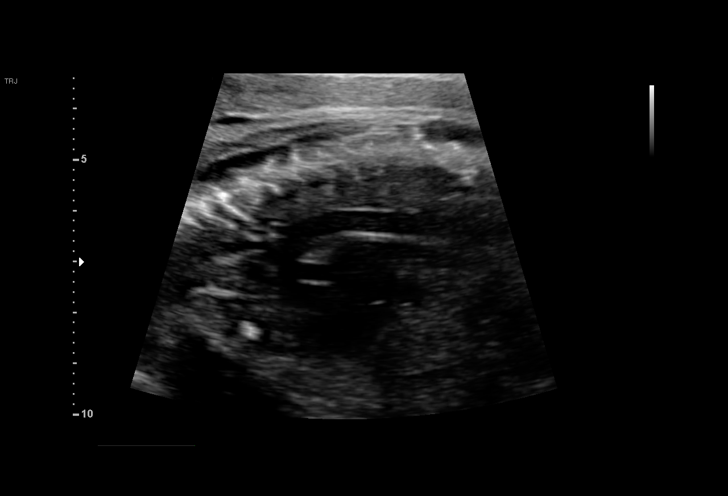
[im 41/41]
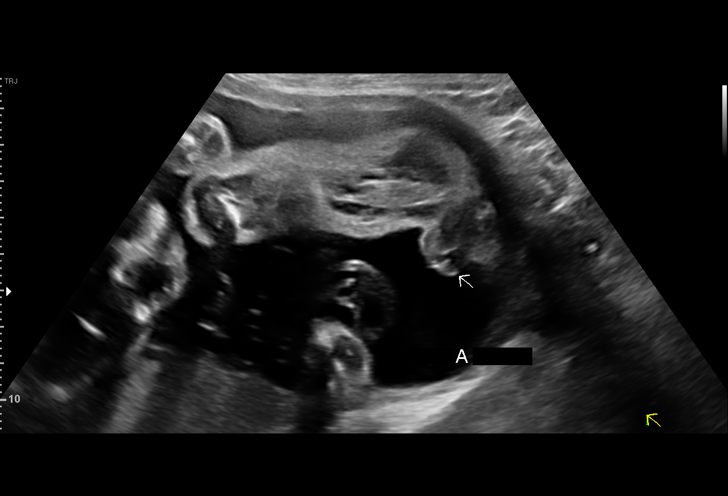

[14 of 28 positions shown; findings below may reference images not displayed]

1  ILMAR FUEN              [id number]      [id number]     [id number]
Indications

26 weeks gestation of pregnancy

Encounter for other antenatal screening
follow-up
Advanced maternal age multigravida 35+,
second trimester; declined testing
Diabetes - Gestational (A1C 6.2 on [DATE])
Poor obstetric history: Prior fetal
macrosomia, antepartum
Obesity complicating pregnancy, second
trimester
OB History

Gravidity:    9         Term:   7        Prem:   0         SAB:   1
TOP:          0       Ectopic:  0        Living: 7
Fetal Evaluation

Num Of Fetuses:     1
Fetal Heart         149
Rate(bpm):
Cardiac Activity:   Observed
Presentation:       Variable
Placenta:           Posterior Left lateral

Amniotic Fluid
AFI FV:      Subjectively within normal limits

Largest Pocket(cm)
5.68
Biometry

BPD:      66.6  mm     G. Age:  26w 6d         59  %    CI:         70.06  %    70 - 86
FL/HC:       18.4  %    18.6 -
HC:      253.8  mm     G. Age:  27w 4d         68  %    HC/AC:       1.15       1.04 -
AC:        221  mm     G. Age:  26w 4d         49  %    FL/BPD:      70.3  %    71 - 87
FL:       46.8  mm     G. Age:  25w 4d         18  %    FL/AC:       21.2  %    20 - 24

Est. FW:     920   gm          2 lb     54  %
Gestational Age

LMP:           22w 5d        Date:  12/06/16                 EDD:    09/12/17
U/S Today:     26w 5d                                        EDD:    08/15/17
Best:          26w 2d     Det. By:  U/S  (04/16/17)          EDD:    08/18/17
Anatomy

Cranium:               Previously seen        Aortic Arch:            Appears normal
Cavum:                 Appears normal         Ductal Arch:            Appears normal
Ventricles:            Appears normal         Diaphragm:              Previously seen
Choroid Plexus:        Previously seen        Stomach:                Appears normal, left
sided
Cerebellum:            Previously seen        Abdomen:                Previously seen
Posterior Fossa:       Previously seen        Abdominal Wall:         Previously seen
Nuchal Fold:           Previously seen        Cord Vessels:           Previously seen
Face:                  Orbits nl; profile not Kidneys:                Appear normal
well visualized
Lips:                  Previously seen        Bladder:                Appears normal
Thoracic:              Appears normal         Spine:                  Previously seen
Heart:                 Appears normal         Upper Extremities:      Previously seen
(4CH, axis, and situs
RVOT:                  Appears normal         Lower Extremities:      Previously seen
LVOT:                  Appears normal
Cervix Uterus Adnexa

Cervix
Length:           4.84  cm.
Normal appearance by transabdominal scan.
Impression

SIUP at 26+2 weeks
Normal interval anatomy; anatomic survey complete except
for profile
Normal amniotic fluid volume
Appropriate interval growth with EFW at the 54th %tile
Recommendations

Follow-up ultrasound for growth in 6 weeks (AMA/DM/obesity)

## 2019-09-30 IMAGING — US US MFM OB FOLLOW-UP
1 series · 14 of 28 positions shown · non-contrast
Comparison: none

[Series 1: us mfm ob follow-up · 52 acquisitions, 14 frames shown]
[im 2/52]
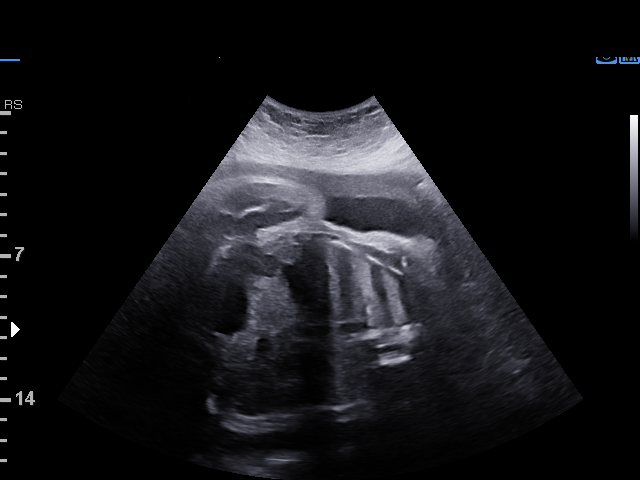
[im 6/52]
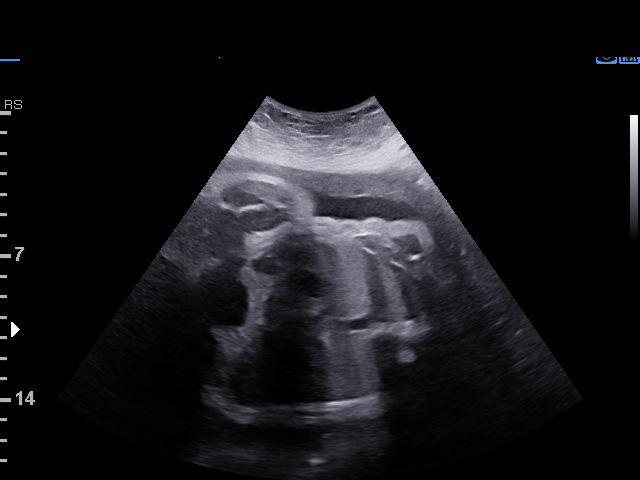
[im 10/52]
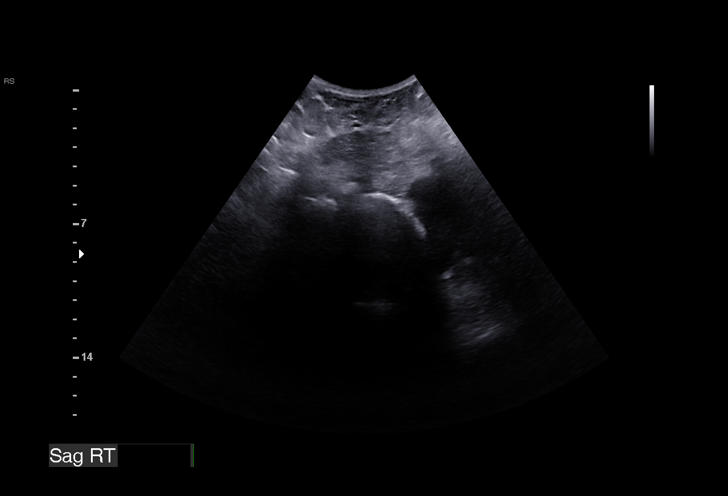
[im 14/52]
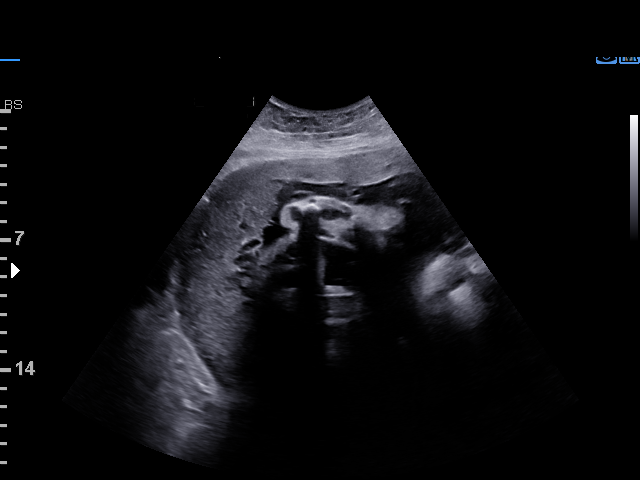
[im 18/52]
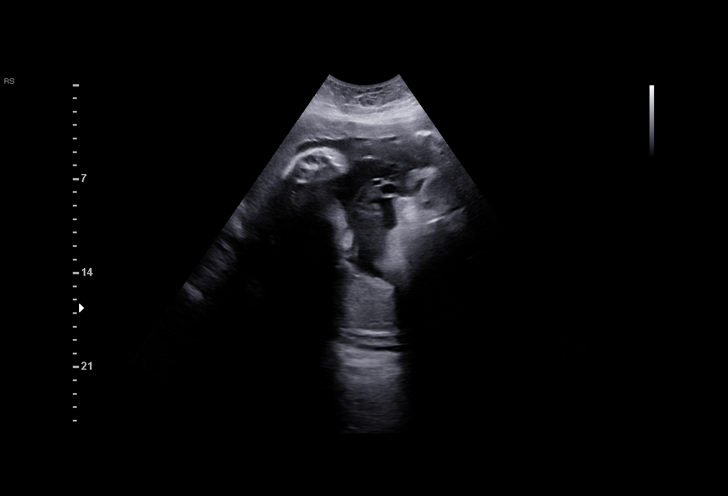
[im 21/52]
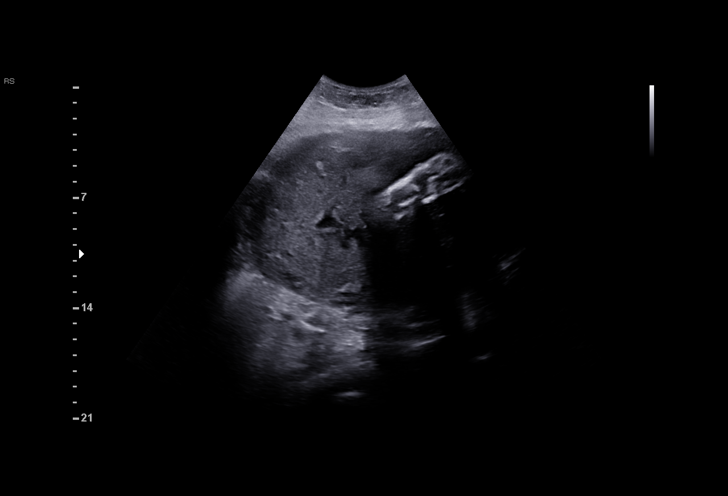
[im 25/52]
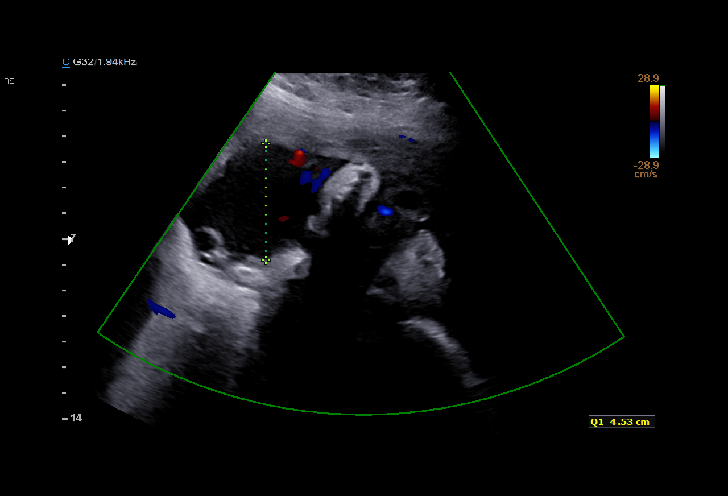
[im 29/52]
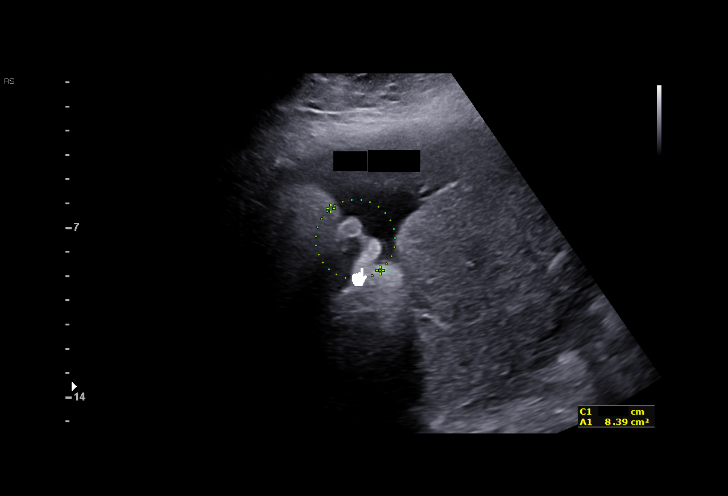
[im 33/52]
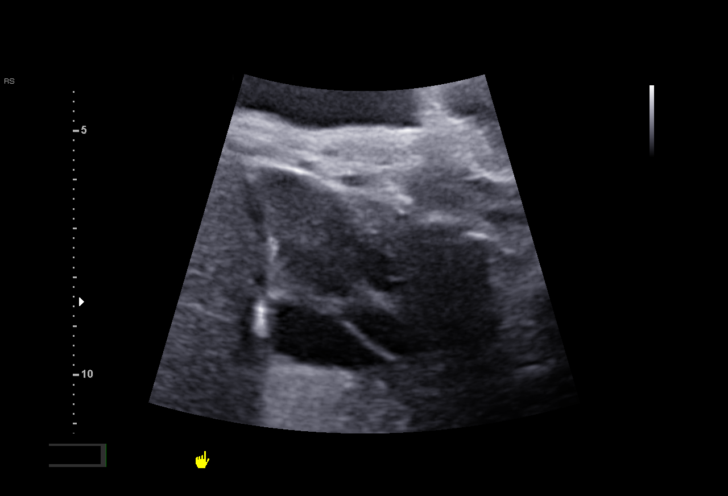
[im 36/52]
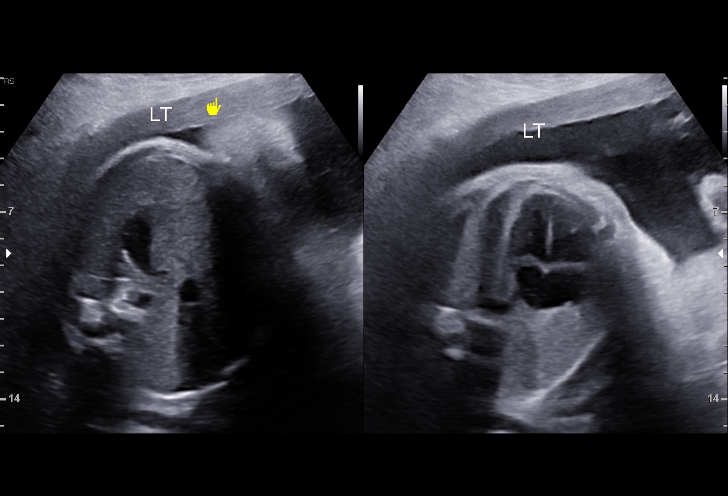
[im 40/52]
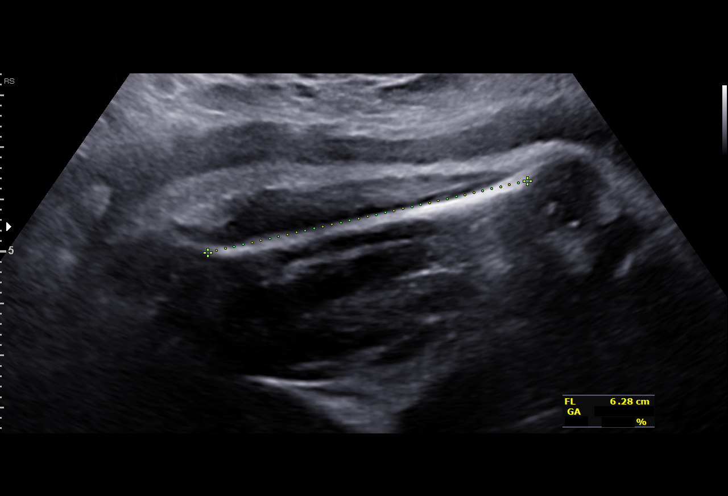
[im 44/52]
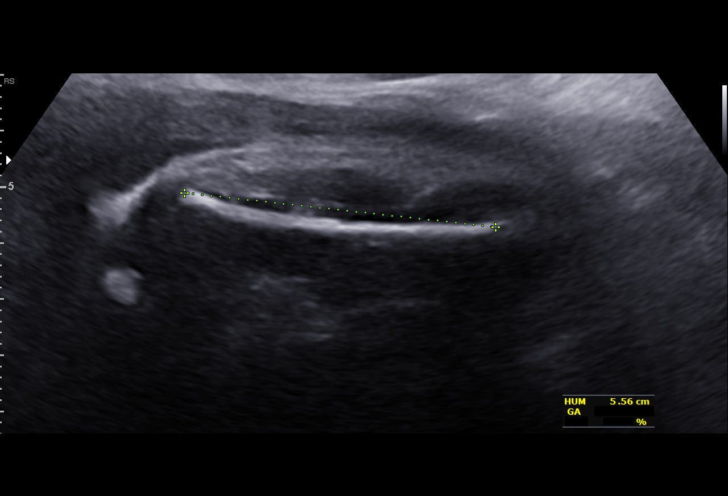
[im 48/52]
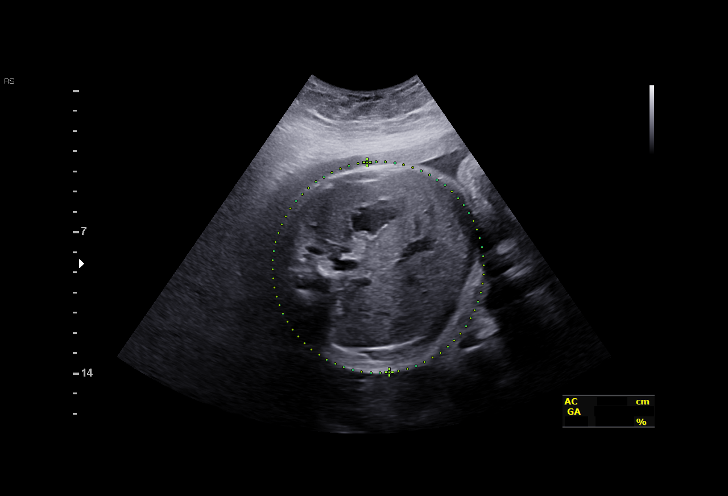
[im 52/52]
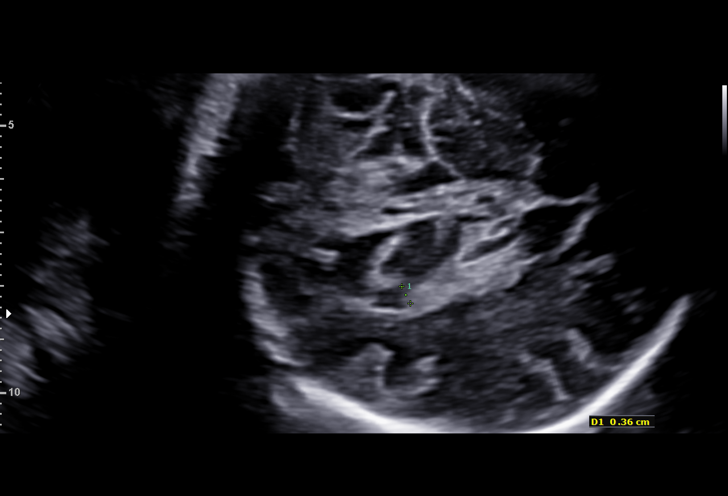

[14 of 28 positions shown; findings below may reference images not displayed]

Indications

35 weeks gestation of pregnancy
Encounter for other antenatal screening
follow-up
Poor obstetric history: Prior fetal
macrosomia, antepartum
Advanced maternal age multigravida 35+,
third trimester; declined testing
Obesity complicating pregnancy, third
trimester
Gestational diabetes in pregnancy, diet
controlled (A1C 6.2 on [DATE])
OB History

Gravidity:    9         Term:   7        Prem:   0        SAB:   1
TOP:          0       Ectopic:  0        Living: 7
Fetal Evaluation

Num Of Fetuses:     1
Fetal Heart         157
Rate(bpm):
Cardiac Activity:   Observed
Presentation:       Cephalic
Placenta:           Posterior, above cervical os (Lt Lat)
P. Cord Insertion:  Previously Visualized

Amniotic Fluid
AFI FV:      Subjectively within normal limits

AFI Sum(cm)     %Tile       Largest Pocket(cm)
14.29           52

RUQ(cm)                     LUQ(cm)        LLQ(cm)
4.53
Biometry

BPD:      90.4  mm     G. Age:  36w 5d         85  %    CI:        80.15   %    70 - 86
FL/HC:       19.9  %    20.1 -
HC:        319  mm     G. Age:  35w 6d         30  %    HC/AC:       0.98       0.93 -
AC:      325.2  mm     G. Age:  36w 3d         82  %    FL/BPD:      70.4  %    71 - 87
FL:       63.6  mm     G. Age:  32w 6d        < 3  %    FL/AC:       19.6  %    20 - 24
HUM:      56.3  mm     G. Age:  32w 5d         14  %

Est. FW:    6772   gm    5 lb 15 oz     64  %
Gestational Age

LMP:           31w 6d        Date:  12/06/16                 EDD:   09/12/17
U/S Today:     35w 3d                                        EDD:   08/18/17
Best:          35w 3d     Det. By:  U/S  (04/16/17)          EDD:   08/18/17
Anatomy



Other:  Fetus appears to be a female. Technically difficult due to maternal
habitus and fetal position.
Cervix Uterus Adnexa

Cervix
Not visualized (advanced GA >82wks)

Uterus
No abnormality visualized.

Left Ovary
Not visualized.

Right Ovary
Not visualized.

Adnexa:       No abnormality visualized. No adnexal mass
visualized.
Impression

Gestational diabetes. Well-controlled on diet.
Fetal growth is appropriate for gestational age. Amniotic fluid
is normal. Incidental BPP is [DATE].
We reassured the patient of the findings.
I recommended weekly antenatal testing from next week.
Patient informed that she lives far away and will not be able
to come her.
Recommendations

An appointment was made for her to return in 4 weeks for
fetal growth assessment and BPP.
